# Patient Record
Sex: Male | Born: 1950 | Race: Black or African American | Hispanic: No | Marital: Married | Smoking: Current some day smoker
Health system: Southern US, Community
[De-identification: ages and names within clinical notes are randomized; demographics above are authoritative.]

## PROBLEM LIST (undated history)

## (undated) DIAGNOSIS — C61 Malignant neoplasm of prostate: Secondary | ICD-10-CM

## (undated) DIAGNOSIS — E119 Type 2 diabetes mellitus without complications: Secondary | ICD-10-CM

## (undated) DIAGNOSIS — E46 Unspecified protein-calorie malnutrition: Secondary | ICD-10-CM

---

## 2011-08-23 ENCOUNTER — Emergency Department: Payer: Self-pay | Admitting: Emergency Medicine

## 2011-12-09 ENCOUNTER — Ambulatory Visit: Payer: Self-pay | Admitting: Oncology

## 2011-12-10 LAB — PSA: PSA: 14.1 ng/mL — ABNORMAL HIGH (ref 0.0–4.0)

## 2011-12-13 ENCOUNTER — Ambulatory Visit: Payer: Self-pay | Admitting: Oncology

## 2012-01-13 ENCOUNTER — Ambulatory Visit: Payer: Self-pay | Admitting: Oncology

## 2012-02-02 ENCOUNTER — Emergency Department: Payer: Self-pay | Admitting: Emergency Medicine

## 2012-02-02 LAB — COMPREHENSIVE METABOLIC PANEL
Albumin: 4.1 g/dL (ref 3.4–5.0)
Anion Gap: 15 (ref 7–16)
Bilirubin,Total: 0.9 mg/dL (ref 0.2–1.0)
Chloride: 98 mmol/L (ref 98–107)
Co2: 24 mmol/L (ref 21–32)
Creatinine: 1.14 mg/dL (ref 0.60–1.30)
EGFR (African American): >60
Osmolality: 288 (ref 275–301)
SGOT(AST): 15 U/L (ref 15–37)
Sodium: 137 mmol/L (ref 136–145)
Total Protein: 8.7 g/dL — ABNORMAL HIGH (ref 6.4–8.2)

## 2012-02-02 LAB — CBC
HCT: 49.4 % (ref 40.0–52.0)
MCV: 81 fL (ref 80–100)
Platelet: 253 10*3/uL (ref 150–440)
RBC: 6.09 10*6/uL — ABNORMAL HIGH (ref 4.40–5.90)
RDW: 18.5 % — ABNORMAL HIGH (ref 11.5–14.5)
WBC: 6.4 10*3/uL (ref 3.8–10.6)

## 2012-02-02 LAB — TROPONIN I: Troponin-I: <0.02 ng/mL

## 2012-10-23 IMAGING — US US PELVIS LIMITED
1 series · 17 of 25 positions shown · non-contrast
Comparison: none

REASON FOR EXAM: testicular pain and swelling
COMMENTS:

[Series 1: us pelvis limited · 17 of 55 slices shown]
[im 1/55]
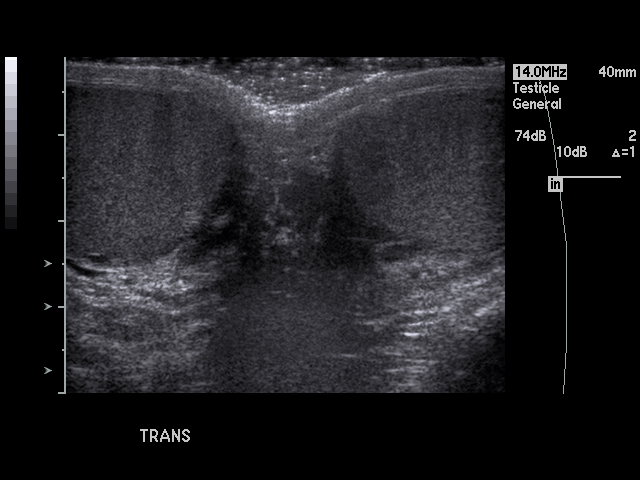
[im 5/55]
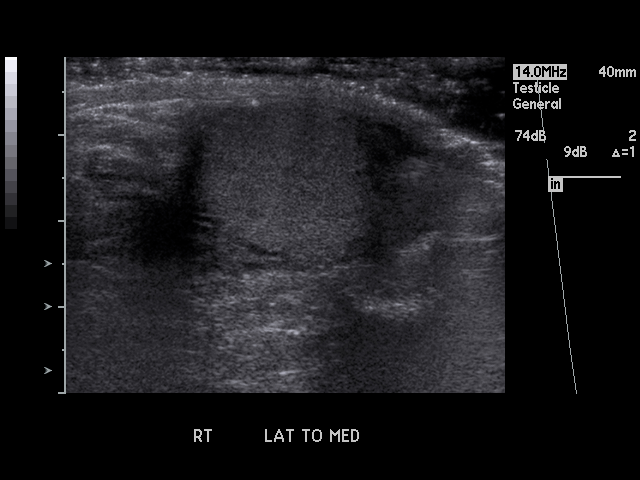
[im 7/55]
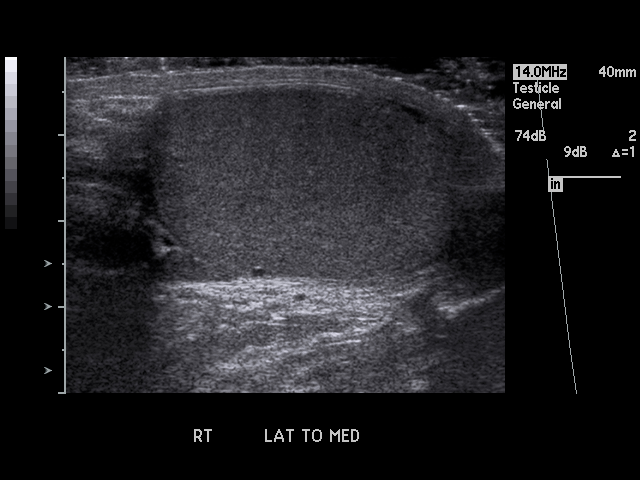
[im 12/55]
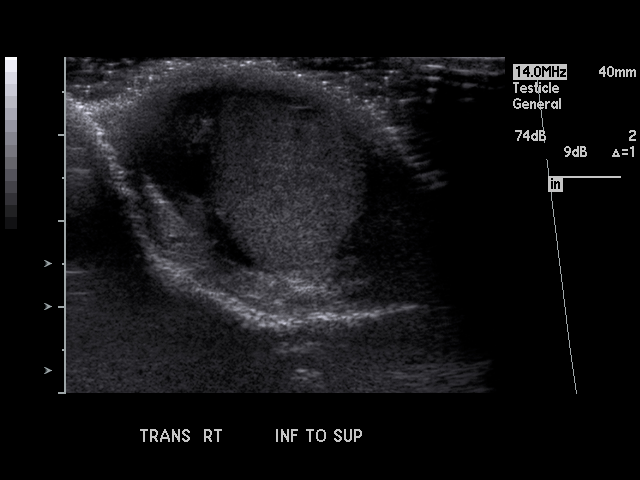
[im 14/55]
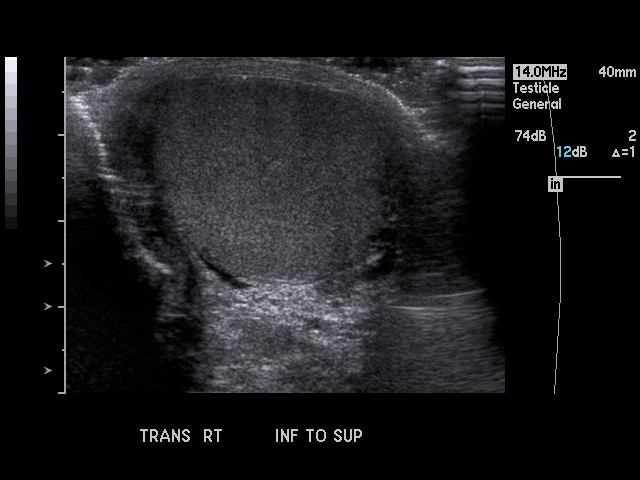
[im 19/55]
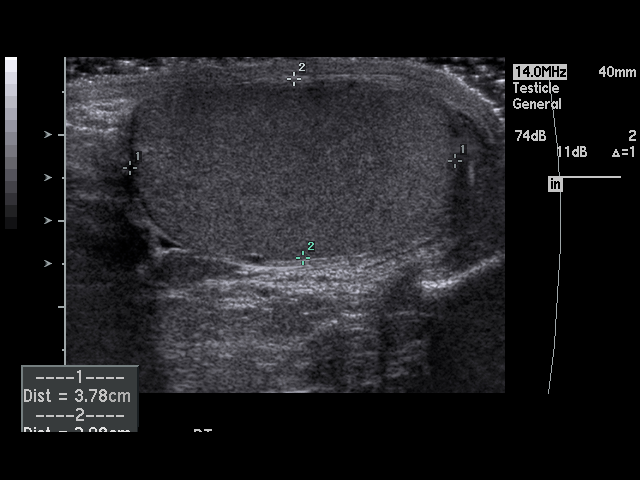
[im 21/55]
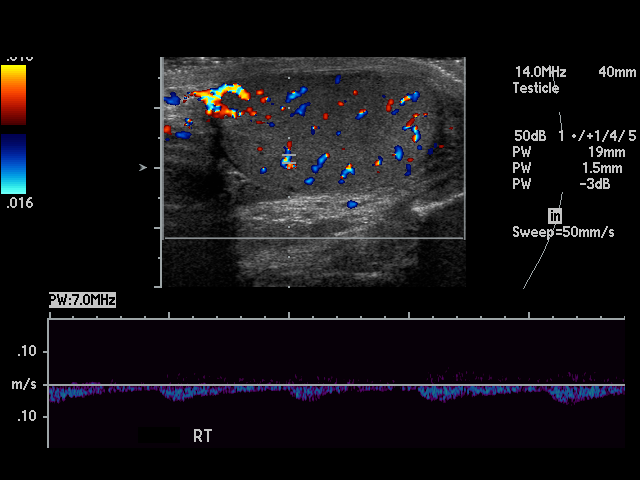
[im 25/55]
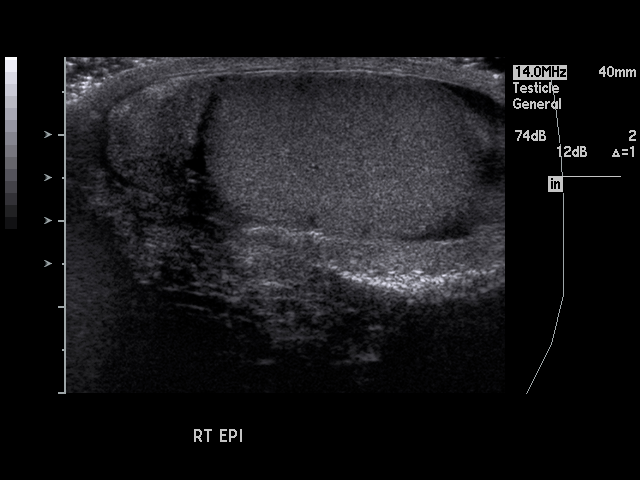
[im 28/55]
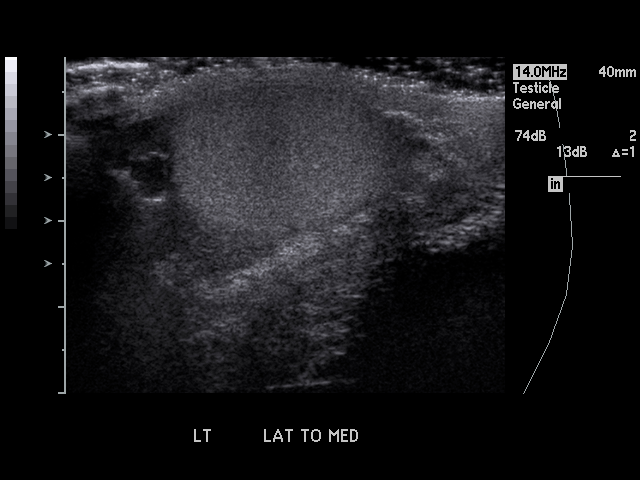
[im 30/55]
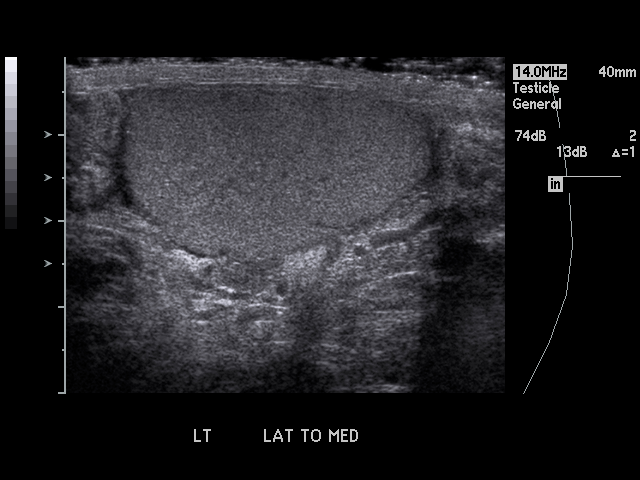
[im 34/55]
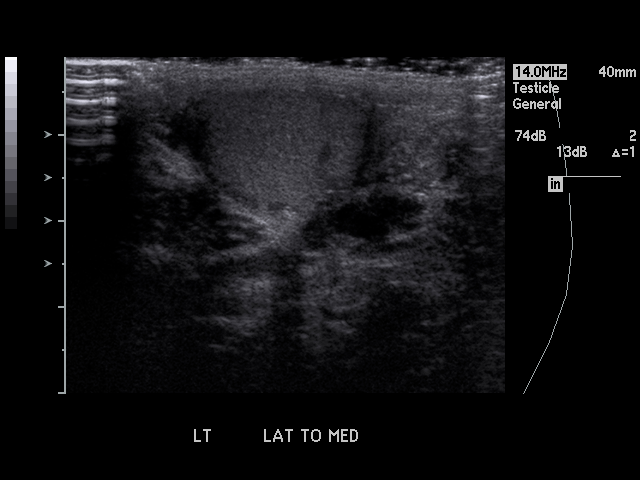
[im 37/55]
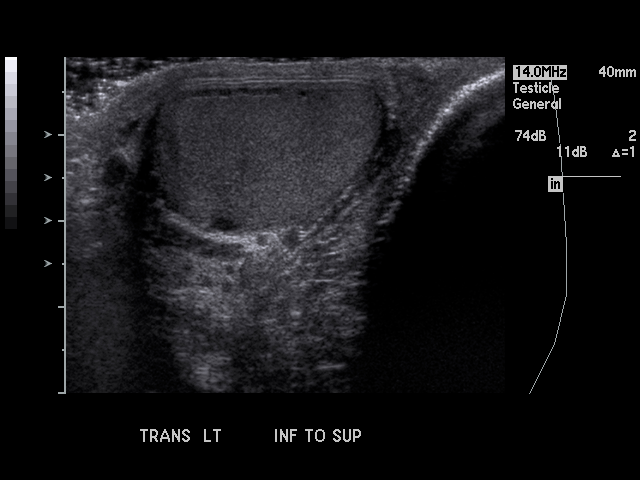
[im 41/55]
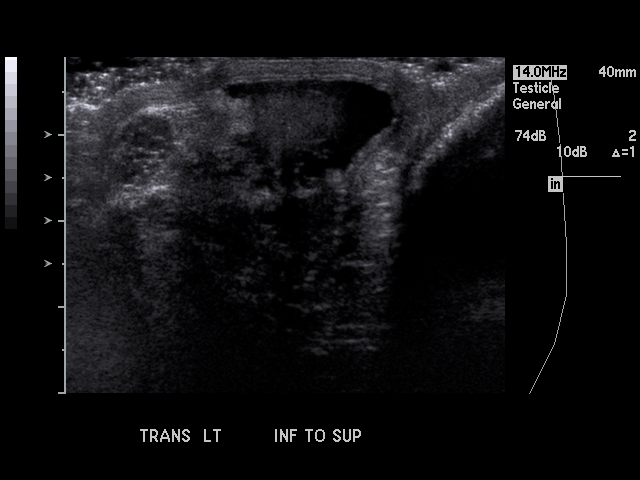
[im 43/55]
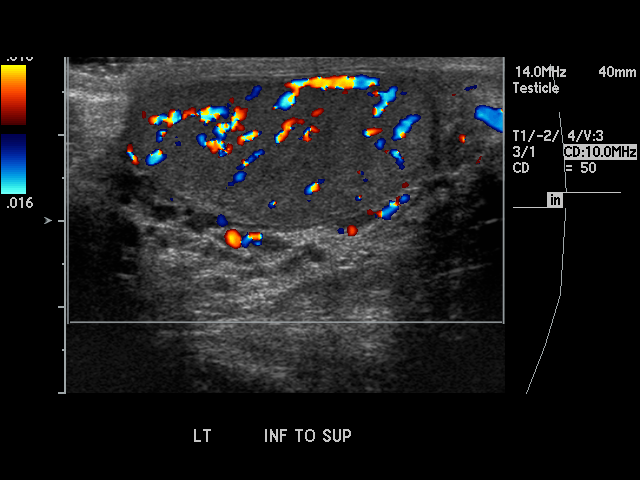
[im 48/55]
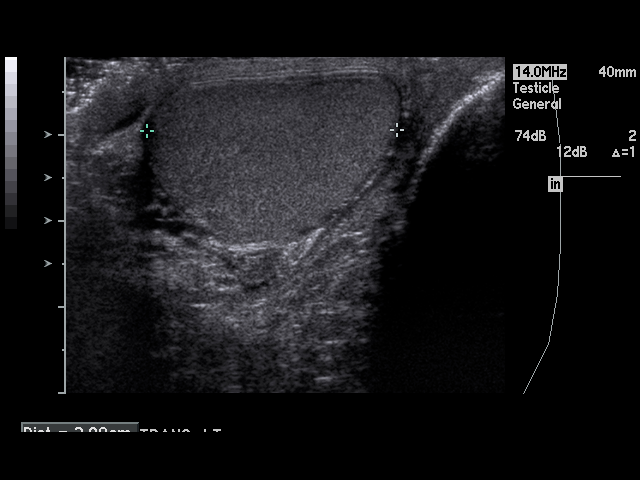
[im 50/55]
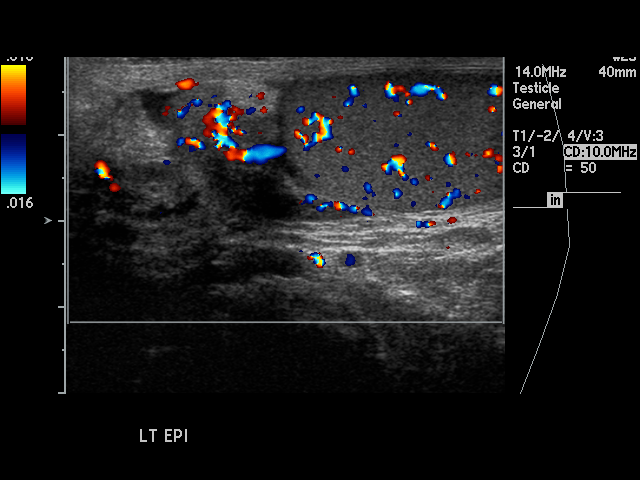
[im 55/55]
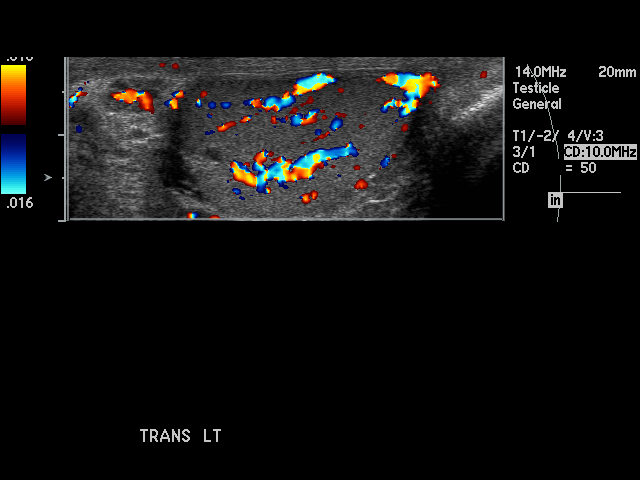

[17 of 25 positions shown; findings below may reference images not displayed]

PROCEDURE:     US  - US TESTICULAR  - August 23, 2011  [DATE]

RESULT:     The testes exhibit normal echotexture. Vascularity appears
normal. A tiny cyst is noted in the left testicle measuring 2 mm in
diameter. The right testicle measures 3.8 x 2.1 x 2.7 cm. The left testicle
measures 3.8 x 1.8 x 2.9 cm. The epididymal structures exhibit normal
echotexture. Vascularity of the epididymal structures is fairly symmetric
from right to left. I see no significant hydrocele nor evidence of a
varicocele.
IMPRESSION: 1. I do not see evidence of testicular torsion.
2. A tiny cyst is noted in the left testicle. Otherwise the testicular
parenchyma is normal in appearance.
3. The left epididymis is slightly larger than the right but the vascularity
appears symmetric from right to left.

## 2013-02-08 IMAGING — CR DG CHEST 2V
1 series · 2 of 2 positions shown · non-contrast
Comparison: none

REASON FOR EXAM: cough
COMMENTS:

[Series 1: pa · 0.17mm/px · 2 of 2 slices shown]
[im 1/2]
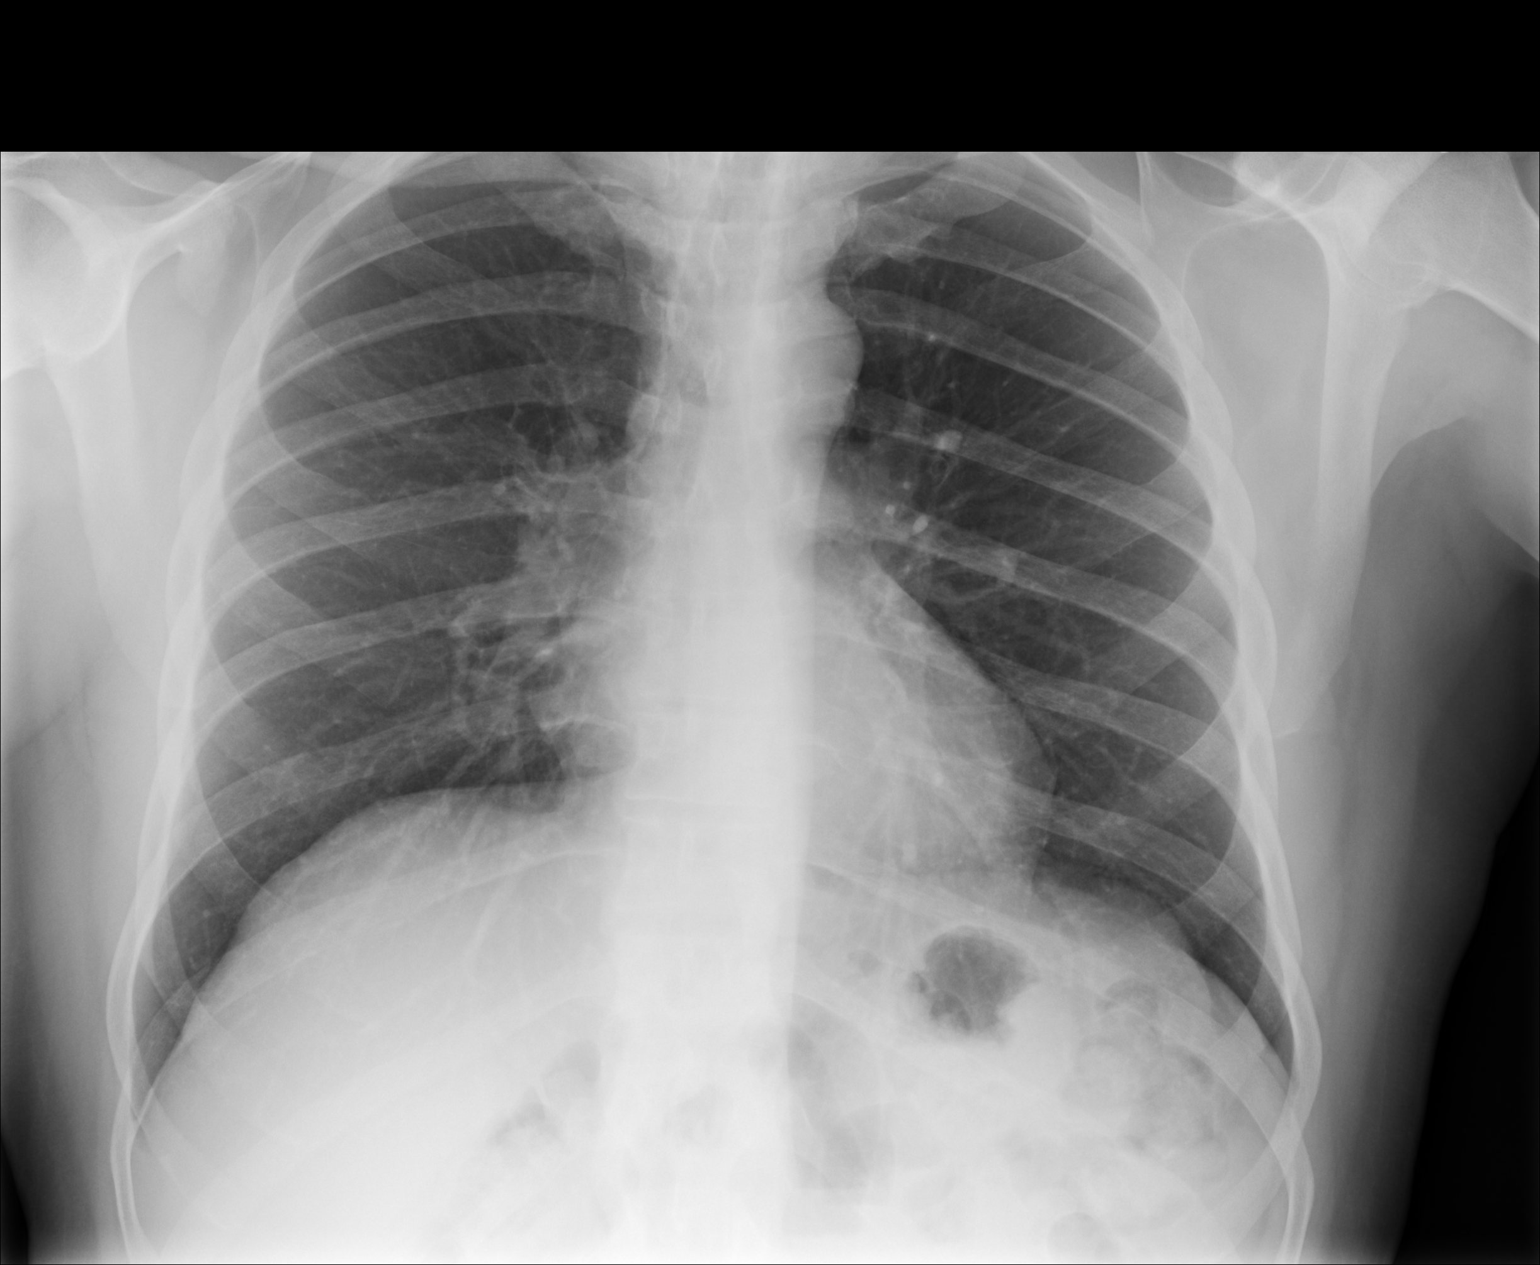
[im 2/2]
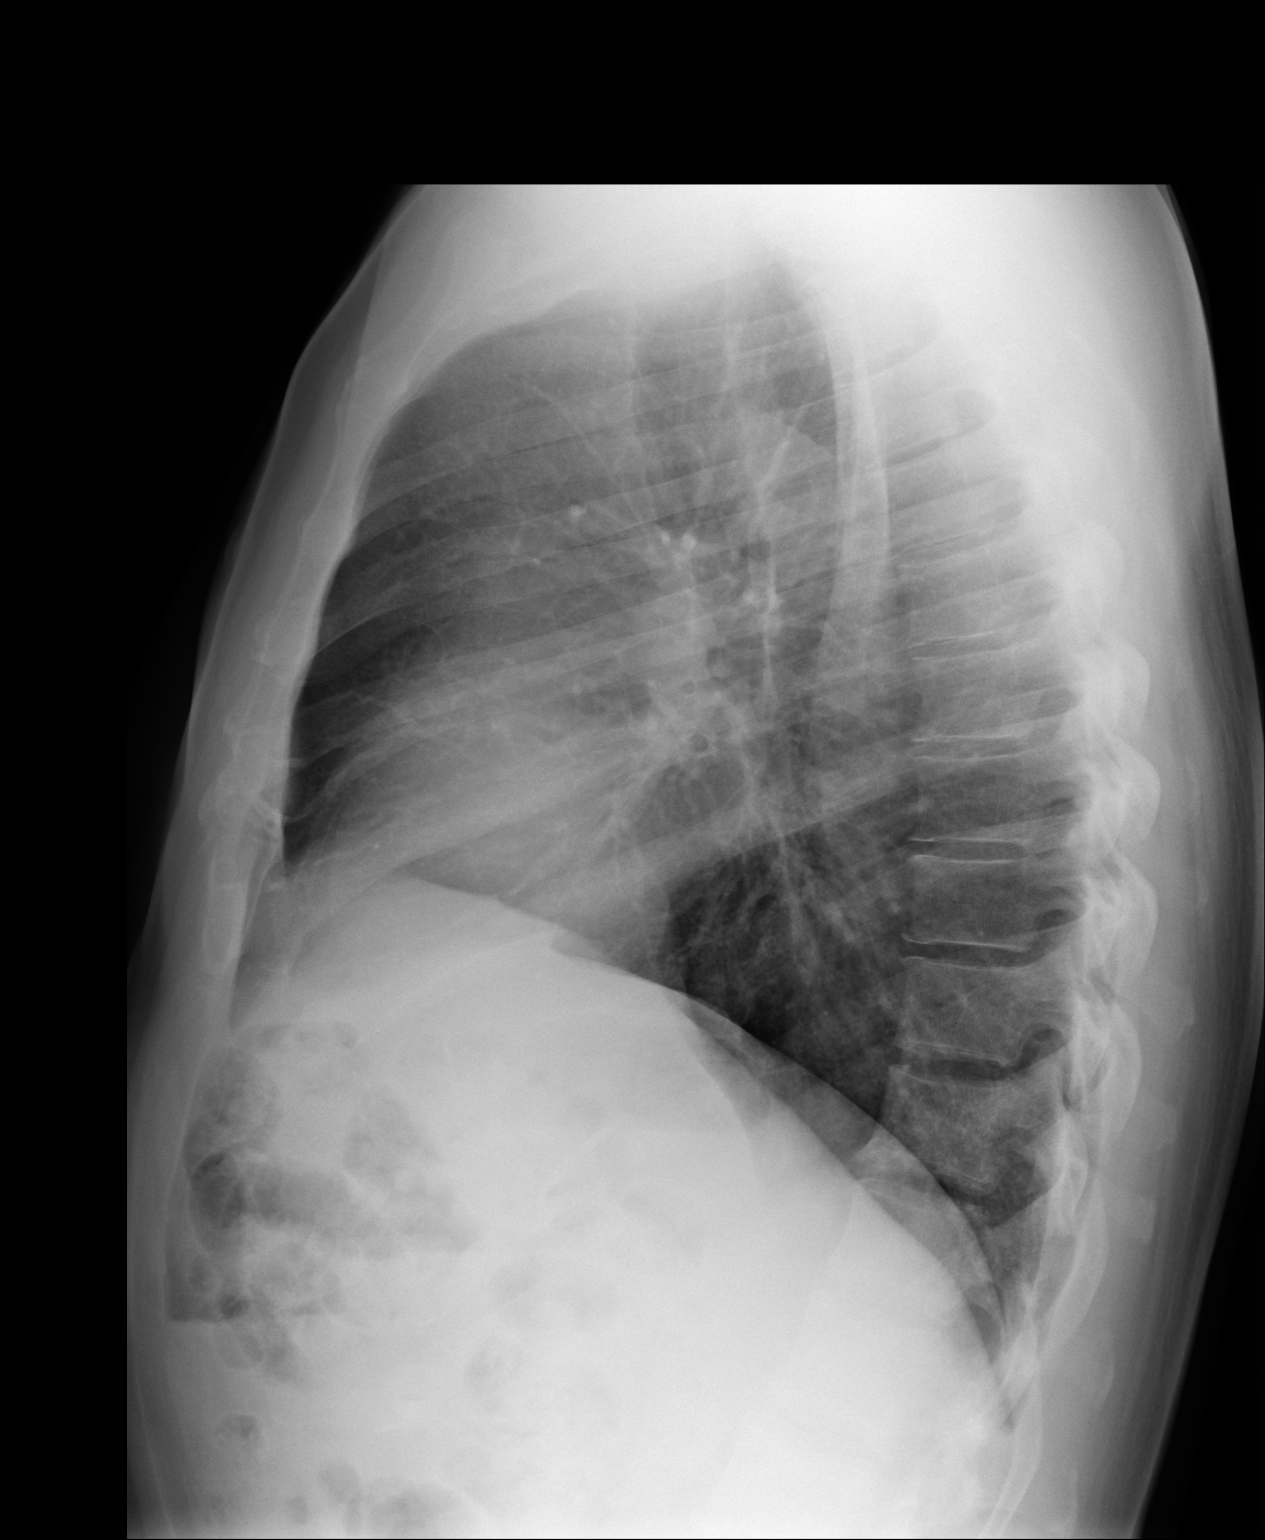

[2 of 2 positions shown; findings below may reference images not displayed]

PROCEDURE:     MDR - MDR CHEST PA(OR AP) AND LATERAL  - December 09, 2011  [DATE]

RESULT:     The lungs are adequately inflated. There is no focal infiltrate.
The cardiac silhouette is normal in size. The pulmonary vascularity is not
engorged. I see no pleural effusion. The mediastinum is normal in width.
IMPRESSION: I do not see evidence of pneumonia nor CHF nor definite
evidence of other acute cardiopulmonary abnormality.

## 2017-05-03 ENCOUNTER — Other Ambulatory Visit: Payer: Self-pay | Admitting: Otolaryngology

## 2017-05-03 DIAGNOSIS — R42 Dizziness and giddiness: Secondary | ICD-10-CM

## 2017-05-12 ENCOUNTER — Ambulatory Visit: Payer: Self-pay

## 2018-06-13 ENCOUNTER — Emergency Department
Admission: EM | Admit: 2018-06-13 | Discharge: 2018-06-13 | Disposition: A | Discharge status: Home / Self Care | Payer: Medicare HMO | Attending: Emergency Medicine | Admitting: Emergency Medicine

## 2018-06-13 ENCOUNTER — Other Ambulatory Visit: Payer: Self-pay

## 2018-06-13 DIAGNOSIS — T675XXA Heat exhaustion, unspecified, initial encounter: Secondary | ICD-10-CM | POA: Diagnosis not present

## 2018-06-13 DIAGNOSIS — E860 Dehydration: Secondary | ICD-10-CM | POA: Diagnosis present

## 2018-06-13 DIAGNOSIS — M6281 Muscle weakness (generalized): Secondary | ICD-10-CM | POA: Diagnosis not present

## 2018-06-13 DIAGNOSIS — R531 Weakness: Secondary | ICD-10-CM

## 2018-06-13 LAB — COMPREHENSIVE METABOLIC PANEL
ALBUMIN: 3.4 g/dL — AB (ref 3.5–5.0)
ALK PHOS: 71 U/L (ref 38–126)
ALT: 10 U/L (ref 0–44)
ANION GAP: 4 — AB (ref 5–15)
AST: 31 U/L (ref 15–41)
BILIRUBIN TOTAL: 1.5 mg/dL — AB (ref 0.3–1.2)
BUN: 14 mg/dL (ref 8–23)
CALCIUM: 8.2 mg/dL — AB (ref 8.9–10.3)
CO2: 26 mmol/L (ref 22–32)
Chloride: 109 mmol/L (ref 98–111)
Creatinine, Ser: 1.12 mg/dL (ref 0.61–1.24)
GFR calc Af Amer: >60 mL/min (ref >60)
Glucose, Bld: 223 mg/dL — ABNORMAL HIGH (ref 70–99)
Potassium: 4.8 mmol/L (ref 3.5–5.1)
Sodium: 139 mmol/L (ref 135–145)
TOTAL PROTEIN: 6.4 g/dL — AB (ref 6.5–8.1)

## 2018-06-13 LAB — CBC
HEMATOCRIT: 42.8 % (ref 40.0–52.0)
HEMOGLOBIN: 14 g/dL (ref 13.0–18.0)
MCH: 26.7 pg (ref 26.0–34.0)
MCHC: 32.7 g/dL (ref 32.0–36.0)
MCV: 81.7 fL (ref 80.0–100.0)
Platelets: 247 10*3/uL (ref 150–440)
RBC: 5.24 MIL/uL (ref 4.40–5.90)
RDW: 19.7 % — AB (ref 11.5–14.5)
WBC: 6.3 10*3/uL (ref 3.8–10.6)

## 2018-06-13 LAB — TROPONIN I

## 2018-06-13 LAB — CK: Total CK: 40 U/L — ABNORMAL LOW (ref 49–397)

## 2018-06-13 MED ORDER — SODIUM CHLORIDE 0.9 % IV SOLN
1000.0000 mL | Freq: Once | INTRAVENOUS | Status: AC
  Administered 2018-06-13: 1000 mL via INTRAVENOUS

## 2018-06-13 NOTE — ED Triage Notes (Signed)
Pt presents today via [facility] from home. Pt was outside to long and became nausea, vomiting, pt presents with an 18g IV Left forearm.... Last pressure is 130/80 NSR on monitor.

## 2018-06-13 NOTE — ED Provider Notes (Signed)
[facility] Emergency Department Provider Note   ____________________________________________    I have reviewed the triage vital signs and the nursing notes.   HISTORY  Chief Complaint Dehydration     HPI George Chavez is a 67 y.o. male who presents with complaints of generalized weakness.  Patient reports he was working in the yard about 4 days ago and the severe heat became dizzy and was drenched with sweat.  He felt like he had to lie down or he might faint.  He had a difficult time getting cool and also had episodes of vomiting.  Since that time he is felt rundown.  He has still has occasional nausea but took some Zofran at home and overall feels weak.  Denies chest pain or palpitations.  No shortness of breath or cough.  No calf pain or swelling   No past medical history on file.  There are no active problems to display for this patient.     Prior to Admission medications   Not on File     Allergies Patient has no allergy information on record.  No family history on file.  Social History Smokes cigarettes, no alcohol  Review of Systems  Constitutional: No fever/chills Eyes: No visual changes.  ENT: No sore throat. Cardiovascular: Denies chest pain. Respiratory: Denies shortness of breath. Gastrointestinal: No abdominal pain.  No nausea, no vomiting.   Genitourinary: Negative for dysuria. Musculoskeletal: Negative for back pain. Skin: Negative for rash. Neurological: Negative for headaches   ____________________________________________   PHYSICAL EXAM:  VITAL SIGNS: ED Triage Vitals  Enc Vitals Group     BP 06/13/18 1354 (!) 143/81     Pulse Rate 06/13/18 1354 60     Resp 06/13/18 1354 13     Temp 06/13/18 1354 98.6 F (37 C)     Temp Source 06/13/18 1354 Oral     SpO2 06/13/18 1354 99 %     Weight 06/13/18 1359 72.6 kg (160 lb)     Height 06/13/18 1359 1.727 m (5\' 8" )     Head Circumference --      Peak Flow  --      Pain Score --      Pain Loc --      Pain Edu? --      Excl. in [place]? --     Constitutional: Alert and oriented. No acute distress. Pleasant and interactive Eyes: Conjunctivae are normal.   Nose: No congestion/rhinnorhea. Mouth/Throat: Mucous membranes are moist.    Cardiovascular: Normal rate, regular rhythm. Grossly normal heart sounds.  Good peripheral circulation. Respiratory: Normal respiratory effort.  No retractions. Lungs CTAB. Gastrointestinal: Soft and nontender. No distention.  No CVA tenderness. Genitourinary: deferred Musculoskeletal: No lower extremity tenderness nor edema.  Warm and well perfused Neurologic:  Normal speech and language. No gross focal neurologic deficits are appreciated.  Skin:  Skin is warm, dry and intact. No rash noted. Psychiatric: Mood and affect are normal. Speech and behavior are normal.  ____________________________________________   LABS (all labs ordered are listed, but only abnormal results are displayed)  Labs Reviewed  CBC - Abnormal; Notable for the following components:      Result Value   RDW 19.7 (*)    All other components within normal limits  COMPREHENSIVE METABOLIC PANEL - Abnormal; Notable for the following components:   Glucose, Bld 223 (*)    Calcium 8.2 (*)    Total Protein 6.4 (*)    Albumin 3.4 (*)  Total Bilirubin 1.5 (*)    Anion gap 4 (*)    All other components within normal limits  CK - Abnormal; Notable for the following components:   Total CK 40 (*)    All other components within normal limits  TROPONIN I   ____________________________________________  EKG   ____________________________________________  RADIOLOGY  None ____________________________________________   PROCEDURES  Procedure(s) performed: No  Procedures   Critical Care performed: No ____________________________________________   INITIAL IMPRESSION / ASSESSMENT AND PLAN / ED COURSE  Pertinent labs & imaging  results that were available during my care of the patient were reviewed by me and considered in my medical decision making (see chart for details).  Patient presents with complaints of diffuse weakness, occasional nausea.  Although he seems to be improving over the last several days.  Does sound consistent with heat exhaustion on day 1 we will check labs give IV fluids evaluate for acute kidney injury, check CK and troponin and CBC.  Lab work is all quite reassuring.  Patient feels significantly better after IV fluids.  No indication for admission at this time, recommend outpatient follow-up, return precautions discussed.      ____________________________________________   FINAL CLINICAL IMPRESSION(S) / ED DIAGNOSES  Final diagnoses:  Generalized weakness  Heat exhaustion, initial encounter        Note:  This document was prepared using Dragon voice recognition software and may include unintentional dictation errors.    Lavonia Drafts, MD 06/13/18 [phone]

## 2021-08-24 ENCOUNTER — Emergency Department
Admission: EM | Admit: 2021-08-24 | Discharge: 2021-08-24 | Disposition: A | Discharge status: Home / Self Care | Payer: Medicare HMO | Attending: Student in an Organized Health Care Education/Training Program | Admitting: Student in an Organized Health Care Education/Training Program

## 2021-08-24 ENCOUNTER — Other Ambulatory Visit: Payer: Self-pay

## 2021-08-24 DIAGNOSIS — Z5321 Procedure and treatment not carried out due to patient leaving prior to being seen by health care provider: Secondary | ICD-10-CM | POA: Insufficient documentation

## 2021-08-24 DIAGNOSIS — R531 Weakness: Secondary | ICD-10-CM | POA: Diagnosis present

## 2021-08-24 DIAGNOSIS — R630 Anorexia: Secondary | ICD-10-CM | POA: Insufficient documentation

## 2021-08-24 LAB — BASIC METABOLIC PANEL
Anion gap: 13 (ref 5–15)
BUN: 24 mg/dL — ABNORMAL HIGH (ref 8–23)
CO2: 29 mmol/L (ref 22–32)
Calcium: 10.7 mg/dL — ABNORMAL HIGH (ref 8.9–10.3)
Chloride: 95 mmol/L — ABNORMAL LOW (ref 98–111)
Creatinine, Ser: 1.2 mg/dL (ref 0.61–1.24)
GFR, Estimated: >60 mL/min (ref >60)
Glucose, Bld: 156 mg/dL — ABNORMAL HIGH (ref 70–99)
Potassium: 4.3 mmol/L (ref 3.5–5.1)
Sodium: 137 mmol/L (ref 135–145)

## 2021-08-24 LAB — CBC
HCT: 43.2 % (ref 39.0–52.0)
Hemoglobin: 14.2 g/dL (ref 13.0–17.0)
MCH: 26.1 pg (ref 26.0–34.0)
MCHC: 32.9 g/dL (ref 30.0–36.0)
MCV: 79.3 fL — ABNORMAL LOW (ref 80.0–100.0)
Platelets: 259 10*3/uL (ref 150–400)
RBC: 5.45 MIL/uL (ref 4.22–5.81)
RDW: 18.1 % — ABNORMAL HIGH (ref 11.5–15.5)
WBC: 5.9 10*3/uL (ref 4.0–10.5)
nRBC: 0 % (ref 0.0–0.2)

## 2021-08-24 NOTE — ED Triage Notes (Signed)
Pt to ED via [facility] with complaints of weakness, loss of appetite. He has lost some weight in the last few weeks.

## 2021-11-11 ENCOUNTER — Telehealth: Payer: Self-pay | Admitting: Nurse Practitioner

## 2021-11-11 NOTE — Telephone Encounter (Signed)
Attempted to reach patient/wife on the home and cell number to offer to schedule a Palliative Consult, no answer.  I wasn't able to leave a message on wife's cell but did leave a message at the home number with reason for call along with my name and call back number, requesting a return call.

## 2021-12-03 ENCOUNTER — Telehealth: Payer: Self-pay | Admitting: Nurse Practitioner

## 2021-12-03 NOTE — Telephone Encounter (Signed)
Rec'd return call from patient's son Efton, and after discussing the Palliative referral/services with him he and patient were in agreement with beginning Palliative services.  I have scheduled an In-home Consult for 12/16/21 @ 3:30 PM

## 2021-12-03 NOTE — Telephone Encounter (Signed)
Attempted to contact patient's wife Levander Campion to schedule Palliative Consult, no answer - left message requesting a return call.  I also called and left a message at the home number as well.

## 2021-12-16 ENCOUNTER — Other Ambulatory Visit: Payer: Self-pay | Admitting: Nurse Practitioner

## 2021-12-17 ENCOUNTER — Other Ambulatory Visit: Payer: Self-pay | Admitting: Nurse Practitioner

## 2021-12-17 ENCOUNTER — Other Ambulatory Visit: Payer: Self-pay

## 2021-12-17 ENCOUNTER — Encounter: Payer: Self-pay | Admitting: Nurse Practitioner

## 2021-12-17 VITALS — HR 114 | Resp 20

## 2021-12-17 DIAGNOSIS — E860 Dehydration: Secondary | ICD-10-CM

## 2021-12-17 DIAGNOSIS — R630 Anorexia: Secondary | ICD-10-CM

## 2021-12-17 DIAGNOSIS — C61 Malignant neoplasm of prostate: Secondary | ICD-10-CM

## 2021-12-17 DIAGNOSIS — C7951 Secondary malignant neoplasm of bone: Secondary | ICD-10-CM

## 2021-12-17 DIAGNOSIS — Z515 Encounter for palliative care: Secondary | ICD-10-CM

## 2021-12-17 NOTE — Progress Notes (Signed)
[facility] Palliative Care Consult Note Telephone: [phone]  Fax: [phone]   Date of encounter: 12/17/21 4:57 PM PATIENT NAME: George Chavez [address] [place] [address]   [phone] (home)  DOB: [birth year] MRN: [id number] PRIMARY CARE PROVIDER:    [facility],  [place] [address] [phone]  REFERRING PROVIDER:   [facility] [place],  [place] [address] [phone]  RESPONSIBLE PARTY:    Contact Information     Name Relation Home Work Mobile   George Chavez Spouse [phone]  [phone]   [place] Parent [phone]        I met face to face with patient and family in home. Palliative Care was asked to follow this patient by consultation request of  George Chavez, [facility] to address advance care planning and complex medical decision making. This is the initial visit.  ASSESSMENT AND PLAN / RECOMMENDATIONS:  Advance Care Planning/Goals of Care: Goals include to maximize quality of life and symptom management. Patient/health care surrogate gave his/her permission to discuss.Our advance care planning conversation included a discussion about:    The value and importance of advance care planning  Experiences with loved ones who have been seriously ill or have died  Exploration of personal, cultural or spiritual beliefs that might influence medical decisions  Exploration of goals of care in the event of a sudden injury or illness  Identification  of a healthcare agent  Review and updating or creation of an  advance directive document . Decision not to resuscitate or to de-escalate disease focused treatments due to poor prognosis. CODE STATUS: DNR  Symptom Management/Plan: 1. Advance Care Planning; Discussed code status with George Chavez wishes for DNR; goldenrod form completed, placed in vynca. Discussed at length with George Chavez, George Chavez daughter in law, son George Chavez (by  video) and George Chavez concerning aggressive vs conservative vs comfort care. We talked about current treatment with radiation/chemotherapy with palliative focus. We talked about quality of life.  2. Anorexia/dehydration secondary to metastatic prostate cancer discussed nutrition; discussed with George Chavez, George Chavez, son and George Chavez Daughter inlaw concern for severe dehydration reflective tachycardia, dry mucous membranes, severe weakness. Temporal wasting, cachetic with clinical presentation. Discussed options for rehydration and recommended to go to ED for IVF's. George Chavez and George Chavez wishes to take George. Buenger back to [place] where his Oncologist is located. George Chavez, George Chavez and I talked about George Chavez no longer able to be by himself or live independently. George Chavez and George Chavez reside in [place] with hope of transporting George Chavez home with them though they have not established care with Oncology in that area and next chemotherapy is in 1 week. We talked at length chemotherapy vs comfort care. We talked about overall rapid decline, severe debility, George. Costlow appears very ill. George Chavez wishes to take George Chavez back to [facility] ED by POV. Discussed risks of transport POV rather than EMS. We talked about code status. George Chavez wishes to be a DNR, golden rod form completed and will put in vynca. We talked about role pc. Assisted George Chavez to get George Chavez into her vehicle. George Chavez was alert, oriented, waved bye. Therapeutic listening, emotional support provided. Questions answered. Will f/u once disposition made. Discussed with George Chavez separately from George Chavez if aggressive treatment is ceased then can proceed with Hospice services as George. Seavey does appear to be at end of life. George Chavez in agreement, will request visit with  Oncology to further discuss.   4. Goals of Care: Goals include to maximize quality of life and symptom management. Our advance care planning conversation included a discussion about:    The value and  importance of advance care planning  Exploration of personal, cultural or spiritual beliefs that might influence medical decisions  Exploration of goals of care in the event of a sudden injury or illness  Identification and preparation of a healthcare agent  Review and updating or creation of an advance directive document.  5. Palliative care encounter; Palliative care encounter; Palliative medicine team will continue to support patient, patient's family, and medical team. Visit consisted of counseling and education dealing with the complex and emotionally intense issues of symptom management and palliative care in the setting of serious and potentially life-threatening illness  Follow up Palliative Care Visit: Palliative care will continue to follow for complex medical decision making, advance care planning, and clarification of goals. Return 2 weeks or prn.  I spent 78 minutes providing this consultation. More than 50% of the time in this consultation was spent in counseling and care coordination. PPS: 30% Chief Complaint: Initial Palliative consult for complex medical decision making  HISTORY OF PRESENT ILLNESS:  George Chavez is a 71 y.o. 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 I called to confirm initial PC visit with George Chavez, George. Chavez daughter-inloaw in agreement with covid negative. I visited George. Chavez in his home, he was sitting on the couch, appears severely debilitated, weak, cachetic with temporal wasting, dry. George. Chavez did make eye contact but quickly fell back asleep. George Chavez George. Chavez daughter-in-law in person, George Chavez (George Ellis son per video) discussed pmh, dx cancer with ongoing treatment. We talked about symptoms, ros, we talked about pain. George. Schipani endorses the current pain medication does help with relief. We talked about  recent falls. We talked about difficulty ambulating as George. Min does use a cane. George. Winch has been unable to independently go from a sitting to standing position. George. Hoard talked about not eating much, no appetite. George. Winterhalter has been residing independently with George Chavez and Hoy making intermit visits. George Chavez endorses she fixes his medications box, ensure he has food. We talked about concern of George. Langland staying by himself, safety concern. George Chavez endorses they have been thinking about bringing George. Glander back to [place] although has not established with Oncology. We talked about chemotherapy, radiation, quality of life. We talked about current clinical presentation. We talked about concern for need for IVF as George. Rambert does appear severely dehydrated. See plan. George. Jaycob, Mcclenton and Samnang in agreement to plan. Therapeutic listening, emotional support provided. Assisted George Chavez to dress George. Tugwell and used a chair to transfer him from the couch to the car safety. Questions answered.   ONCOLOGY HISTORY   Cancer Staging  No matching staging information was found for the patient.  06/28/16 robotic assisted laparoscopic prostatectomy with bilateral lymph node dissection. Final surgical pathology - pT2a, N0, Mx, R0, Gleason 3+4=7 adenocarcinoma of the prostate. There was absence of seminal vesicle invasion. There was absence of extracapsular extension. There was absence of bladder neck invasion. 08/08/16 PSA 0.05 11/07/16 PSA 0.04 02/13/17 PSA 0.08 03/06/17 - Rad onc consult; recommended for salvage radiation 05/11/17 - had a stroke, lost to follow up to rad onc and urology 09/29/21 - presents to ED; CT LUMBAR/THORACIC SPINE: Diffuse  lytic lesions throughout the axial skeleton including the thoracic and lumbar spine consistent with osseous metastases. Age-indeterminate pathologic fractures of the T9 and T12 vertebral bodies with mild vertebral body height loss. No retropulsion. Additional possible  age-indeterminate pathologic fracture versus pathologic Schmorl's node of the L2 inferior endplate. Extensive epidural extension of tumor in the sacrum encasing the bilateral sacral nerve roots. Extent of epidural involvement would be better characterized on George. Patchy opacities in the right middle and lower lobes of the lungs, possibly representing infection or inflammation. Left AMA 10/05/21 - urgent add on med onc clinic visit; saddle anesthesia, sent to ED; MRI showed widespread spinal osseous metastatic disease with pathologic compression deformities of the T3 and T12 vertebral bodies. Mild epidural metastatic disease posterior to the T12 vertebral body. Sacral osseous metastatic disease includes soft tissue extension/epidural disease which appears to encase some of the exiting sacral nerves, however given plane of imaging this is suboptimally evaluated. If clinically indicated, dedicated MRI of the sacrum with and without contrast could provide further characterization. Incompletely evaluated subcentimeter left hepatic lobe lesion is indeterminate. If clinically warranted, dedicated liver protocol MRI could provide further characterization. PSA >7000 10/08/21: urgent add on back to clinic; start firmagon, consent for docetaxel, start decadron 12m PO daily, rad onc referral, start oxycodone 11/7-11/11/22: radiation to L2-L3  10/29/21: PSA 2,250; C1D1 docetaxel  History obtained from review of EMR, discussion with primary team, and interview with family, facility staff/caregiver and/or George. PWrede  I reviewed available labs, medications, imaging, studies and related documents from the EMR.  Records reviewed and summarized above.   ROS Reviewed 10 point system all negative except HPI  Physical Exam: Constitutional: NAD General: frail appearing, cachetic, severe muscle wasting; severe weakness, chronically ill, male EYES: lids intact, temporal wasting ENMT: oral mucous membranes dry CV: S1S2,  RRR Pulmonary: decrease breath sounds throughout; no increased work of breathing, +cough, room air Abdomen:  soft and non tender MSK: able to stand when lifted to pivot; severe muscle wasting Skin: warm and dry Neuro:  +severe generalized weakness,  no cognitive impairment Psych: flat affect, A and O x 3  Social/FAMILY HX: reviewed  ALLERGIES: No Known Allergies   Thank you for the opportunity to participate in the care of George. PBallentine  The palliative care team will continue to follow. Please call our office at [phone] we can be of additional assistance.   Questions and concerns were addressed. The patient/family was encouraged to call with questions and/or concerns. My business card was provided. Provided general support and encouragement, no other unmet needs identified   Tyreka Henneke Z Kaetlyn Noa, NP ,   COVID-19 PATIENT SCREENING TOOL Asked and negative response unless otherwise noted:  Have you had symptoms of covid, tested positive or been in contact with someone with symptoms/positive test in the past 5-10 days? NO

## 2022-01-07 ENCOUNTER — Telehealth: Payer: Self-pay

## 2022-01-07 NOTE — Telephone Encounter (Signed)
1223 pm.  Message received from Natalia Leatherwood, NP to schedule a home visit with patient after hospital discharge.  Phone call made to the listed number but no answer.  Message left requesting a call back.

## 2022-01-19 ENCOUNTER — Telehealth: Payer: Self-pay

## 2022-01-19 NOTE — Telephone Encounter (Signed)
141 pm.  Phone call made to Dow Adolph to complete a check-in and schedule a home visit.  No answer.  Message has been left requesting a call back.

## 2022-02-27 ENCOUNTER — Inpatient Hospital Stay (HOSPITAL_COMMUNITY)
Admission: EM | Admit: 2022-02-27 | Discharge: 2022-03-04 | DRG: 391 | Disposition: A | Discharge status: Home / Self Care | Payer: Medicare HMO | Attending: Internal Medicine | Admitting: Internal Medicine

## 2022-02-27 ENCOUNTER — Emergency Department (HOSPITAL_COMMUNITY): Payer: Medicare HMO

## 2022-02-27 ENCOUNTER — Encounter (HOSPITAL_COMMUNITY): Payer: Self-pay | Admitting: Emergency Medicine

## 2022-02-27 ENCOUNTER — Other Ambulatory Visit: Payer: Self-pay

## 2022-02-27 DIAGNOSIS — E785 Hyperlipidemia, unspecified: Secondary | ICD-10-CM | POA: Diagnosis present

## 2022-02-27 DIAGNOSIS — I959 Hypotension, unspecified: Secondary | ICD-10-CM | POA: Diagnosis present

## 2022-02-27 DIAGNOSIS — E869 Volume depletion, unspecified: Secondary | ICD-10-CM | POA: Diagnosis present

## 2022-02-27 DIAGNOSIS — R109 Unspecified abdominal pain: Secondary | ICD-10-CM | POA: Diagnosis present

## 2022-02-27 DIAGNOSIS — C7951 Secondary malignant neoplasm of bone: Secondary | ICD-10-CM | POA: Diagnosis present

## 2022-02-27 DIAGNOSIS — R1084 Generalized abdominal pain: Secondary | ICD-10-CM

## 2022-02-27 DIAGNOSIS — C61 Malignant neoplasm of prostate: Secondary | ICD-10-CM | POA: Diagnosis present

## 2022-02-27 DIAGNOSIS — E43 Unspecified severe protein-calorie malnutrition: Secondary | ICD-10-CM | POA: Diagnosis present

## 2022-02-27 DIAGNOSIS — K5900 Constipation, unspecified: Principal | ICD-10-CM

## 2022-02-27 DIAGNOSIS — L89152 Pressure ulcer of sacral region, stage 2: Secondary | ICD-10-CM | POA: Diagnosis present

## 2022-02-27 DIAGNOSIS — Z20822 Contact with and (suspected) exposure to covid-19: Secondary | ICD-10-CM | POA: Diagnosis present

## 2022-02-27 DIAGNOSIS — G893 Neoplasm related pain (acute) (chronic): Secondary | ICD-10-CM | POA: Diagnosis present

## 2022-02-27 DIAGNOSIS — K5903 Drug induced constipation: Principal | ICD-10-CM | POA: Diagnosis present

## 2022-02-27 DIAGNOSIS — T402X5A Adverse effect of other opioids, initial encounter: Secondary | ICD-10-CM | POA: Diagnosis present

## 2022-02-27 DIAGNOSIS — Z79899 Other long term (current) drug therapy: Secondary | ICD-10-CM

## 2022-02-27 DIAGNOSIS — L8990 Pressure ulcer of unspecified site, unspecified stage: Secondary | ICD-10-CM | POA: Insufficient documentation

## 2022-02-27 DIAGNOSIS — E872 Acidosis, unspecified: Secondary | ICD-10-CM | POA: Diagnosis present

## 2022-02-27 DIAGNOSIS — Z79891 Long term (current) use of opiate analgesic: Secondary | ICD-10-CM

## 2022-02-27 DIAGNOSIS — Z681 Body mass index (BMI) 19 or less, adult: Secondary | ICD-10-CM

## 2022-02-27 DIAGNOSIS — T458X5A Adverse effect of other primarily systemic and hematological agents, initial encounter: Secondary | ICD-10-CM | POA: Diagnosis present

## 2022-02-27 DIAGNOSIS — F17210 Nicotine dependence, cigarettes, uncomplicated: Secondary | ICD-10-CM | POA: Diagnosis present

## 2022-02-27 DIAGNOSIS — D72829 Elevated white blood cell count, unspecified: Secondary | ICD-10-CM | POA: Diagnosis present

## 2022-02-27 DIAGNOSIS — Z23 Encounter for immunization: Secondary | ICD-10-CM

## 2022-02-27 HISTORY — DX: Malignant neoplasm of prostate: C61

## 2022-02-27 LAB — COMPREHENSIVE METABOLIC PANEL
ALT: 11 U/L (ref 0–44)
AST: 29 U/L (ref 15–41)
Albumin: 3.1 g/dL — ABNORMAL LOW (ref 3.5–5.0)
Alkaline Phosphatase: 126 U/L (ref 38–126)
Anion gap: 13 (ref 5–15)
BUN: 13 mg/dL (ref 8–23)
CO2: 22 mmol/L (ref 22–32)
Calcium: 7.7 mg/dL — ABNORMAL LOW (ref 8.9–10.3)
Chloride: 98 mmol/L (ref 98–111)
Creatinine, Ser: 0.76 mg/dL (ref 0.61–1.24)
GFR, Estimated: >60 mL/min (ref >60)
Glucose, Bld: 154 mg/dL — ABNORMAL HIGH (ref 70–99)
Potassium: 4.4 mmol/L (ref 3.5–5.1)
Sodium: 133 mmol/L — ABNORMAL LOW (ref 135–145)
Total Bilirubin: 0.9 mg/dL (ref 0.3–1.2)
Total Protein: 5.5 g/dL — ABNORMAL LOW (ref 6.5–8.1)

## 2022-02-27 LAB — CBC
HCT: 37.2 % — ABNORMAL LOW (ref 39.0–52.0)
Hemoglobin: 12.2 g/dL — ABNORMAL LOW (ref 13.0–17.0)
MCH: 26.8 pg (ref 26.0–34.0)
MCHC: 32.8 g/dL (ref 30.0–36.0)
MCV: 81.6 fL (ref 80.0–100.0)
Platelets: 349 10*3/uL (ref 150–400)
RBC: 4.56 MIL/uL (ref 4.22–5.81)
RDW: 22 % — ABNORMAL HIGH (ref 11.5–15.5)
WBC: 50.5 10*3/uL (ref 4.0–10.5)
nRBC: 0 % (ref 0.0–0.2)

## 2022-02-27 LAB — LACTIC ACID, PLASMA
Lactic Acid, Venous: 4.9 mmol/L (ref 0.5–1.9)
Lactic Acid, Venous: 3.1 mmol/L (ref 0.5–1.9)

## 2022-02-27 LAB — PROTIME-INR
INR: 1 (ref 0.8–1.2)
Prothrombin Time: 13.4 seconds (ref 11.4–15.2)

## 2022-02-27 LAB — LIPASE, BLOOD: Lipase: 23 U/L (ref 11–51)

## 2022-02-27 LAB — RESP PANEL BY RT-PCR (FLU A&B, COVID) ARPGX2
Influenza A by PCR: NEGATIVE
Influenza B by PCR: NEGATIVE
SARS Coronavirus 2 by RT PCR: NEGATIVE

## 2022-02-27 LAB — APTT: aPTT: 27 seconds (ref 24–36)

## 2022-02-27 MED ORDER — LACTATED RINGERS IV SOLN: INTRAVENOUS | Status: AC

## 2022-02-27 MED ORDER — KETOROLAC TROMETHAMINE 15 MG/ML IJ SOLN
15.0000 mg | Freq: Four times a day (QID) | INTRAMUSCULAR | Status: DC | PRN
  Administered 2022-03-01 – 2022-03-02 (×3): 15 mg via INTRAVENOUS
  Filled 2022-02-27 (×3): qty 1

## 2022-02-27 MED ORDER — ENSURE ENLIVE PO LIQD
237.0000 mL | Freq: Two times a day (BID) | ORAL | Status: DC
  Administered 2022-02-28 – 2022-03-01 (×2): 237 mL via ORAL

## 2022-02-27 MED ORDER — VANCOMYCIN HCL IN DEXTROSE 1-5 GM/200ML-% IV SOLN
1000.0000 mg | Freq: Once | INTRAVENOUS | Status: AC
  Administered 2022-02-27: 1000 mg via INTRAVENOUS
  Filled 2022-02-27: qty 200

## 2022-02-27 MED ORDER — SODIUM CHLORIDE 0.9 % IV SOLN
2.0000 g | Freq: Two times a day (BID) | INTRAVENOUS | Status: DC
  Administered 2022-02-27: 2 g via INTRAVENOUS
  Filled 2022-02-27: qty 2

## 2022-02-27 MED ORDER — POLYETHYLENE GLYCOL 3350 17 G PO PACK
17.0000 g | PACK | Freq: Two times a day (BID) | ORAL | Status: DC
  Administered 2022-02-27 – 2022-03-04 (×9): 17 g via ORAL
  Filled 2022-02-27 (×10): qty 1

## 2022-02-27 MED ORDER — LACTATED RINGERS IV BOLUS
1000.0000 mL | Freq: Once | INTRAVENOUS | Status: AC
  Administered 2022-02-27: 1000 mL via INTRAVENOUS

## 2022-02-27 MED ORDER — FLEET ENEMA 7-19 GM/118ML RE ENEM
1.0000 | ENEMA | Freq: Once | RECTAL | Status: AC
  Administered 2022-02-28: 1 via RECTAL
  Filled 2022-02-27: qty 1

## 2022-02-27 MED ORDER — ONDANSETRON HCL 4 MG PO TABS: 4.0000 mg | ORAL_TABLET | Freq: Four times a day (QID) | ORAL | Status: DC | PRN

## 2022-02-27 MED ORDER — INFLUENZA VAC A&B SA ADJ QUAD 0.5 ML IM PRSY
0.5000 mL | PREFILLED_SYRINGE | INTRAMUSCULAR | Status: AC
  Administered 2022-03-01: 0.5 mL via INTRAMUSCULAR
  Filled 2022-02-27: qty 0.5

## 2022-02-27 MED ORDER — ENOXAPARIN SODIUM 30 MG/0.3ML IJ SOSY
30.0000 mg | PREFILLED_SYRINGE | INTRAMUSCULAR | Status: DC
  Administered 2022-02-27 – 2022-03-03 (×5): 30 mg via SUBCUTANEOUS
  Filled 2022-02-27 (×5): qty 0.3

## 2022-02-27 MED ORDER — SODIUM CHLORIDE 0.9 % IV SOLN: 2.0000 g | Freq: Three times a day (TID) | INTRAVENOUS | Status: DC

## 2022-02-27 MED ORDER — SENNA 8.6 MG PO TABS
1.0000 | ORAL_TABLET | Freq: Two times a day (BID) | ORAL | Status: DC
  Administered 2022-02-27 – 2022-03-03 (×8): 8.6 mg via ORAL
  Filled 2022-02-27 (×9): qty 1

## 2022-02-27 MED ORDER — ONDANSETRON HCL 4 MG/2ML IJ SOLN: 4.0000 mg | Freq: Four times a day (QID) | INTRAMUSCULAR | Status: DC | PRN

## 2022-02-27 MED ORDER — LIDOCAINE 5 % EX PTCH
1.0000 | MEDICATED_PATCH | CUTANEOUS | Status: DC
  Administered 2022-02-27 – 2022-03-03 (×5): 1 via TRANSDERMAL
  Filled 2022-02-27 (×5): qty 1

## 2022-02-27 MED ORDER — GLYCERIN (LAXATIVE) 2 G RE SUPP
1.0000 | Freq: Once | RECTAL | Status: AC
  Administered 2022-02-27: 1 via RECTAL
  Filled 2022-02-27: qty 1

## 2022-02-27 MED ORDER — BISACODYL 10 MG RE SUPP: 10.0000 mg | Freq: Every day | RECTAL | Status: DC | PRN

## 2022-02-27 MED ORDER — VANCOMYCIN HCL 750 MG/150ML IV SOLN: 750.0000 mg | INTRAVENOUS | Status: DC

## 2022-02-27 MED ORDER — FLEET ENEMA 7-19 GM/118ML RE ENEM: 1.0000 | ENEMA | Freq: Once | RECTAL | Status: DC | PRN

## 2022-02-27 MED ORDER — ACETAMINOPHEN 650 MG RE SUPP: 650.0000 mg | Freq: Four times a day (QID) | RECTAL | Status: DC | PRN

## 2022-02-27 MED ORDER — ACETAMINOPHEN 500 MG PO TABS
1000.0000 mg | ORAL_TABLET | Freq: Four times a day (QID) | ORAL | Status: DC | PRN
  Administered 2022-03-01: 1000 mg via ORAL
  Filled 2022-02-27: qty 2

## 2022-02-27 NOTE — Progress Notes (Signed)
Pharmacy Antibiotic Note ? ?George Chavez is a 71 y.o. male admitted on 02/27/2022 presenting with abdominal pain, hx stage IV prostate cancer, concern for sepsis.  Pharmacy has been consulted for vancomycin and cefepime dosing. ? ?Plan: ?Vancomycin '1000mg'$  IV x 1, then '750mg'$  IV q 24h (eAUC 498, Goal AUC 400-550, SCr used 0.8) ?Cefepime 2g IV q 12 hours ?Monitor renal function, cx and clinical progression to narrow ? ? ?  ? ?Temp (24hrs), Avg:97.5 ?F (36.4 ?C), Min:97.5 ?F (36.4 ?C), Max:97.5 ?F (36.4 ?C) ? ?Recent Labs  ?Lab 02/27/22 ?1507  ?WBC 50.5*  ?CREATININE 0.76  ?  ?CrCl cannot be calculated (Unknown ideal weight.).   ? ?No Known Allergies ? ?Bertis Ruddy, PharmD ?Clinical Pharmacist ?ED Pharmacist Phone # [phone] ?02/27/2022 4:21 PM ? ?

## 2022-02-27 NOTE — Assessment & Plan Note (Signed)
Follows with [facility] oncology, Dr. Charisse March.  On active treatment with Doxetaxol, Zometa, Neulasta and started on Nubeqa on 3/17. ?

## 2022-02-27 NOTE — Hospital Course (Signed)
George Chavez is a 71 y.o. male with medical history significant for stage IV prostate cancer with diffuse osseous metastases and hyperlipidemia who is admitted with hypotension and abdominal pain due to severe constipation. ?

## 2022-02-27 NOTE — Assessment & Plan Note (Addendum)
Severe opioid associated constipation ?Last full bowel movement approximately 10 days prior to admission.  Has been taking daily Senokot and tried MiraLAX over the weekend without benefit. ?-Give Fleet enema ?-Continue scheduled Senokot and MiraLAX ?-Given glycerin suppository, Dulcolax suppository as needed ?-Continue IV fluid hydration ?-May benefit from [facility] in the future ?

## 2022-02-27 NOTE — ED Provider Triage Note (Signed)
Emergency Medicine Provider Triage Evaluation Note ? ?George Chavez , a 71 y.o. male  was evaluated in triage.  Pt complains of constipation. He states that he has not had a bowel movement in 5 days. Patient does have prostate cancer, on chemo for same.  He had a CT scan on Friday at [facility] which showed large stool burden, no other acute findings.  He was given MiraLAX and Ex-Lax which he took 1 of each without success.  States he has been eating and drinking normally, denies nausea or vomiting.  No abdominal pain or tenderness. ? ?Review of Systems  ?Positive: Constipation ?Negative: Fever, chills, nausea, vomiting, abdominal pain. ? ?Physical Exam  ?BP (!) 118/92 (BP Location: Left Arm)   Pulse 91   Temp (!) 97.5 ?F (36.4 ?C) (Oral)   Resp 16   SpO2 96%  ?Gen:   Awake, no distress   ?Resp:  Normal effort  ?MSK:   Moves extremities without difficulty  ?Other:  Abdomen soft nontender ? ?Medical Decision Making  ?Medically screening exam initiated at 3:18 PM.  Appropriate orders placed.  George Chavez was informed that the remainder of the evaluation will be completed by another provider, this initial triage assessment does not replace that evaluation, and the importance of remaining in the ED until their evaluation is complete. ? ? ?  ?Bud Face, PA-C ?02/27/22 1521 ? ?

## 2022-02-27 NOTE — ED Provider Notes (Signed)
?[place] ?Provider Note ? ? ?CSN: [id number] ?Arrival date & time: 02/27/22  1441 ? ?  ? ?History ? ?Chief Complaint  ?Patient presents with  ? Constipation  ? ? ?George Chavez is a 71 y.o. male with a history of active stage IV prostate cancer presenting today with constipation and abdominal pain.  Has not passed stool in 7 days.  Notes decreased appetite and the urge to defecate, but doesn't feel it will pass.  Has never sought medical treatment for constipation prior.  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  Denies N/V, diarrhea, fever, recent illness.  Tried Miralax and Ex-lax without relief.  Currently receiving chemotherapy and transdermal medication to increase his WBC.  Patch is placed after chemotherapy, left on for 24 hours, and then removed.  Most recent removal was Friday evening at 8:30 PM.  Hx of prior CVA.  Followed by [facility]. ? ?The history is provided by the patient and medical records.  ?Constipation ?Associated symptoms: abdominal pain (Diffuse)   ? ?  ? ?Home Medications ?Prior to Admission medications   ?Medication Sig Start Date End Date Taking? Authorizing Provider  ?gabapentin (NEURONTIN) 100 MG capsule Take 200 mg by mouth 2 (two) times daily. 02/05/22  Yes [provider]  ?Multiple Vitamins-Minerals (MULTIVITAMIN ADULT) CHEW Chew 1 tablet by mouth daily.   Yes [provider]  ?oxyCODONE (OXY IR/ROXICODONE) 5 MG immediate release tablet Take 5 mg by mouth in the morning and at bedtime. 11/19/21  Yes [provider]  ?polyethylene glycol (MIRALAX / GLYCOLAX) 17 g packet Take 17 g by mouth daily.   Yes [provider]  ?   ? ?Allergies    ?Patient has no known allergies.   ? ?Review of Systems   ?Review of Systems  ?Gastrointestinal:  Positive for abdominal pain (Diffuse) and constipation.  ? ?Physical Exam ?Updated Vital Signs ?BP 118/70   Pulse 92   Temp (!) 97.5 ?F (36.4 ?C) (Oral)   Resp 14    SpO2 97%  ?Physical Exam ?Vitals and nursing note reviewed.  ?Constitutional:   ?   General: He is not in acute distress. ?   Appearance: He is well-developed. He is cachectic. He is not ill-appearing.  ?HENT:  ?   Head: Normocephalic and atraumatic.  ?Eyes:  ?   Conjunctiva/sclera: Conjunctivae normal.  ?Cardiovascular:  ?   Rate and Rhythm: Normal rate and regular rhythm.  ?   Pulses:     ?     Radial pulses are 1+ on the right side and 1+ on the left side.  ?     Dorsalis pedis pulses are 1+ on the right side and 1+ on the left side.  ?   Heart sounds: Normal heart sounds. No murmur heard. ?Pulmonary:  ?   Effort: Pulmonary effort is normal. No respiratory distress.  ?   Breath sounds: Normal breath sounds.  ?Abdominal:  ?   General: Abdomen is scaphoid. Bowel sounds are increased.  ?   Palpations: Abdomen is soft.  ?   Tenderness: There is generalized abdominal tenderness.  ?Musculoskeletal:     ?   General: No swelling.  ?   Cervical back: Neck supple.  ?   Right lower leg: No edema.  ?   Left lower leg: No edema.  ?Skin: ?   General: Skin is warm and dry.  ?   Capillary Refill: Capillary refill takes less than 2  seconds.  ?Neurological:  ?   General: No focal deficit present.  ?   Mental Status: He is alert and oriented to person, place, and time.  ?Psychiatric:     ?   Mood and Affect: Mood normal.     ?   Behavior: Behavior is cooperative.  ? ? ?ED Results / Procedures / Treatments   ?Labs ?(all labs ordered are listed, but only abnormal results are displayed) ?Labs Reviewed  ?COMPREHENSIVE METABOLIC PANEL - Abnormal; Notable for the following components:  ?    Result Value  ? Sodium 133 (*)   ? Glucose, Bld 154 (*)   ? Calcium 7.7 (*)   ? Total Protein 5.5 (*)   ? Albumin 3.1 (*)   ? All other components within normal limits  ?CBC - Abnormal; Notable for the following components:  ? WBC 50.5 (*)   ? Hemoglobin 12.2 (*)   ? HCT 37.2 (*)   ? RDW 22.0 (*)   ? All other components within normal limits   ?LACTIC ACID, PLASMA - Abnormal; Notable for the following components:  ? Lactic Acid, Venous 4.9 (*)   ? All other components within normal limits  ?LACTIC ACID, PLASMA - Abnormal; Notable for the following components:  ? Lactic Acid, Venous 3.1 (*)   ? All other components within normal limits  ?CULTURE, BLOOD (ROUTINE X 2)  ?CULTURE, BLOOD (ROUTINE X 2)  ?URINE CULTURE  ?LIPASE, BLOOD  ?PROTIME-INR  ?APTT  ?URINALYSIS, ROUTINE W REFLEX MICROSCOPIC  ? ? ?EKG ?None ? ?Radiology ?DG Abdomen 1 View ? ?Result Date: 02/27/2022 ?CLINICAL DATA:  Constipation, no bowel movement for 5 days. EXAM: ABDOMEN - 1 VIEW COMPARISON:  None available. FINDINGS: Large volume of colonic stool. Increased small bowel gas without abnormal distension or obstruction. No obvious free air on the supine view. No visualized radiopaque calculi. Included lung bases are clear. IMPRESSION: Large volume of colonic stool, suggesting constipation. No bowel obstruction. Electronically Signed   By: Keith Rake M.D.   On: 02/27/2022 16:04  ? ?DG Chest Port 1 View ? ?Result Date: 02/27/2022 ?CLINICAL DATA:  Questionable sepsis. EXAM: PORTABLE CHEST 1 VIEW COMPARISON:  February 02, 2012 FINDINGS: The heart, hila, and mediastinum are unchanged and unremarkable. No pneumothorax. Mild opacity in left base is somewhat streaky in appearance. No other acute abnormalities. IMPRESSION: Mild opacity in left base is favored represent atelectasis. Early developing infiltrate considered less likely. No other abnormalities or changes. Electronically Signed   By: Dorise Bullion III M.D.   On: 02/27/2022 17:25   ? ?Procedures ?Marland KitchenCritical Care ?Performed by: Prince Rome, PA-C ?Authorized by: Prince Rome, PA-C  ? ?Critical care provider statement:  ?  Critical care time (minutes):  45 ?  Critical care was necessary to treat or prevent imminent or life-threatening deterioration of the following conditions:  Shock and sepsis ?  Critical care was time  spent personally by me on the following activities:  Development of treatment plan with patient or surrogate, discussions with consultants, evaluation of patient's response to treatment, examination of patient, ordering and review of laboratory studies, ordering and review of radiographic studies, ordering and performing treatments and interventions, pulse oximetry, re-evaluation of patient's condition, review of old charts, blood draw for specimens and obtaining history from patient or surrogate ?  Care discussed with: admitting provider    ? ? ?Medications Ordered in ED ?Medications  ?ceFEPIme (MAXIPIME) 2 g in sodium chloride 0.9 % 100 mL IVPB (0  g Intravenous Stopped 02/27/22 1720)  ?vancomycin (VANCOREADY) IVPB 750 mg/150 mL (has no administration in time range)  ?lactated ringers bolus 1,000 mL (0 mLs Intravenous Stopped 02/27/22 1750)  ?vancomycin (VANCOCIN) IVPB 1000 mg/200 mL premix (0 mg Intravenous Stopped 02/27/22 1750)  ?Glycerin (Adult) 2 g suppository 1 suppository (1 suppository Rectal Given 02/27/22 1747)  ?lactated ringers bolus 1,000 mL (1,000 mLs Intravenous New Bag/Given 02/27/22 1830)  ? ? ?ED Course/ Medical Decision Making/ A&P ?  ?                        ?Medical Decision Making ?Amount and/or Complexity of Data Reviewed ?External Data Reviewed: notes. ?Labs: ordered. Decision-making details documented in ED Course. ?Radiology: ordered and independent interpretation performed. Decision-making details documented in ED Course. ?ECG/medicine tests: ordered and independent interpretation performed. Decision-making details documented in ED Course. ? ?Risk ?OTC drugs. ?Prescription drug management. ?Decision regarding hospitalization. ? ?Critical Care ?Total time providing critical care: 45 minutes ? ?71 y.o. male presents to the ED for concern of Constipation ? Marland Kitchen  This involves an extensive number of treatment options, and is a complaint that carries with it a high risk of complications and morbidity.   The differential diagnosis includes constipation, bowel perforation, sepsis, shock ? ?Comorbidities that complicate the patient evaluation include stage 4 pancreatic cancer ? ?Additional history obtained from i

## 2022-02-27 NOTE — Assessment & Plan Note (Signed)
Likely related to decreased oral intake in setting of poor appetite and severe constipation. ?-Continue IV fluid hydration overnight ?

## 2022-02-27 NOTE — Assessment & Plan Note (Signed)
Hold further narcotics for now given severe constipation.  Start lidocaine patch, oral Tylenol, IV Toradol 15 mg as needed. ?

## 2022-02-27 NOTE — ED Triage Notes (Signed)
Daughter-in-law reports pt just finished last chemo for prostate cancer on Friday.  Reports no bowel movement x 5 days.  Pt had a CT on Friday that showed constipation.  Reports abd cramping today.  Given Miralax yesterday and today.  Took Ex-lax today. ?

## 2022-02-27 NOTE — Assessment & Plan Note (Signed)
WBC up to 50 from 12.3 on 3/17.  Most likely secondary to Neulasta OBI given 2 days prior to admit.  Clinically no signs/symptoms of active infection.  Hold further antibiotics for now. ?

## 2022-02-27 NOTE — H&P (Signed)
?History and Physical  ? ? ?BHARAT ANTILLON HEN:[id number] DOB: 11-Oct-1951 DOA: 02/27/2022 ? ?PCP: [facility]  ?Patient coming from: Home ? ?I have personally briefly reviewed patient's old medical records in [place] ? ?Chief Complaint: Abdominal pain ? ?HPI: ?George Chavez is a 71 y.o. male with medical history significant for stage IV prostate cancer with diffuse osseous metastases and hyperlipidemia who presented to the ED for evaluation of abdominal pain. ? ?Patient follows with [facility]ology for management of his prostate cancer.  He is on active treatment with last on 3/17 receiving docetaxel, Zometa, and Neulasta OBI.  He was also started on oral antiandrogen Nubeqa same day. ? ?Imaging performed 3/17: ?1.  CT chest/abdomen/pelvis showed diffuse osseous metastatic disease similar to prior, improving bibasilar groundglass consolidative opacities, large amount of colonic and rectal stool. ?2.  Bone scan showed redemonstrated multifocal osseous metastatic disease in the axial and appendicular skeleton. ? ?Patient states that he has been on chronic pain medication with Oxy IR since October.  He has also been taking Senokot twice daily for bowel regimen.  He has however been severely constipated with last full bowel movement 10 days ago.  He had very small amount of stool out a few days ago.  He has been having progressive abdominal pain.  He tried MiraLAX last few days without benefit.  Appetite has been low and he has not been eating or drinking as much.  He had some nausea without emesis.  He has occasional dyspnea when his pain flares up.  He denies any chest pain, fevers, diaphoresis, dysuria, obvious bleeding. ? ?ED Course  Labs/Imaging on admission: I have personally reviewed following labs and imaging studies. ? ?Initial vitals showed BP 118/92, pulse 91, RR 16, temp 97.5 ?F, SPO2 96% on room air.  While in the ED patient was hypotensive as low as 70/57, improved after IV  fluids. ? ?Labs show WBC 50.5 (previously 12.3 on 02/25/2022 before Neulasta), hemoglobin 12.2, platelets 349,000, sodium 133, potassium 4.4, bicarb 22, BUN 13, creatinine 0.76, serum glucose 154, LFTs within normal limits, lipase 23, lactic acid 4.9 > 3.1. ? ?Blood cultures in process. ? ?Abdominal x-ray shows large volume of colonic stool without bowel obstruction. ? ?Portable chest x-ray shows mild opacity in left lung base favored to represent atelectasis. ? ?Patient was given 2 L LR, IV vancomycin and cefepime, glycerin suppository.  The hospitalist service was consulted to admit for further evaluation and management. ? ?Review of Systems: All systems reviewed and are negative except as documented in history of present illness above. ? ? ?Past Medical History:  ?Diagnosis Date  ? Prostate cancer [facility])   ? ? ?History reviewed. No pertinent surgical history. ? ?Social History: ? reports that he has been smoking cigarettes. He has been smoking an average of .25 packs per day. He has never used smokeless tobacco. He reports that he does not currently use alcohol. He reports that he does not use drugs. ? ?No Known Allergies ? ?History reviewed. No pertinent family history. ? ? ?Prior to Admission medications   ?Medication Sig Start Date End Date Taking? Authorizing Provider  ?gabapentin (NEURONTIN) 100 MG capsule Take 200 mg by mouth 2 (two) times daily. 02/05/22  Yes [provider]  ?Multiple Vitamins-Minerals (MULTIVITAMIN ADULT) CHEW Chew 1 tablet by mouth daily.   Yes [provider]  ?oxyCODONE (OXY IR/ROXICODONE) 5 MG immediate release tablet Take 5 mg by mouth in the morning and at bedtime.  11/19/21  Yes [provider]  ?polyethylene glycol (MIRALAX / GLYCOLAX) 17 g packet Take 17 g by mouth daily.   Yes [provider]  ? ? ?Physical Exam: ?Vitals:  ? 02/27/22 1930 02/27/22 2006 02/27/22 2015 02/27/22 2113  ?BP: '91/65 94/63 96/66 '$ 91/67  ?Pulse: 91 97 92 88  ?Resp: 15 (!) '30  15 15  '$ ?Temp:    98.3 ?F (36.8 ?C)  ?TempSrc:    Oral  ?SpO2: 98% 97% 98% 99%  ?Weight:    45.1 kg  ?Height:    '5\' 8"'$  (1.727 m)  ? ?Constitutional: Thin man resting supine in bed, calm, intermittently wincing in pain ?Eyes: PERRL, lids and conjunctivae normal ?ENMT: Mucous membranes are dry. Posterior pharynx clear of any exudate or lesions.Normal dentition.  ?Neck: normal, supple, no masses. ?Respiratory: clear to auscultation bilaterally, no wheezing, no crackles. Normal respiratory effort. No accessory muscle use.  ?Cardiovascular: Regular rate and rhythm, no murmurs / rubs / gallops. No extremity edema. 2+ pedal pulses. ?Abdomen: Mild generalized tenderness, no masses palpated. No hepatosplenomegaly. Bowel sounds present.  ?Musculoskeletal: no clubbing / cyanosis. No joint deformity upper and lower extremities. Good ROM, no contractures. Normal muscle tone.  ?Skin: no rashes, lesions, ulcers. No induration ?Neurologic: CN 2-12 grossly intact. Sensation intact. Strength 5/5 in all 4.  ?Psychiatric: Normal judgment and insight. Alert and oriented x 3. Normal mood.  ? ?EKG: Personally reviewed. Normal sinus rhythm without acute ischemic changes.  Similar to prior. ? ?Assessment/Plan ?Principal Problem: ?  Abdominal pain ?Active Problems: ?  Constipation due to opioid therapy ?  Leukocytosis ?  Hypotension ?  Prostate cancer metastatic to bone [facility]) ?  Cancer associated pain ?  ?George Chavez is a 71 y.o. male with medical history significant for stage IV prostate cancer with diffuse osseous metastases and hyperlipidemia who is admitted with hypotension and abdominal pain due to severe constipation. ? ?Assessment and Plan: ?* Abdominal pain ?Severe opioid associated constipation ?Last full bowel movement approximately 10 days prior to admission.  Has been taking daily Senokot and tried MiraLAX over the weekend without benefit. ?-Give Fleet enema ?-Continue scheduled Senokot and MiraLAX ?-Given glycerin  suppository, Dulcolax suppository as needed ?-Continue IV fluid hydration ?-May benefit from [facility] in the future ? ?Hypotension ?Likely related to decreased oral intake in setting of poor appetite and severe constipation. ?-Continue IV fluid hydration overnight ? ?Leukocytosis ?WBC up to 50 from 12.3 on 3/17.  Most likely secondary to Neulasta OBI given 2 days prior to admit.  Clinically no signs/symptoms of active infection.  Hold further antibiotics for now. ? ?Cancer associated pain ?Hold further narcotics for now given severe constipation.  Start lidocaine patch, oral Tylenol, IV Toradol 15 mg as needed. ? ?Prostate cancer metastatic to bone [facility]) ?Follows with [facility]ology, Dr. Charisse March.  On active treatment with Doxetaxol, Zometa, Neulasta and started on Nubeqa on 3/17. ? ?DVT prophylaxis: enoxaparin (LOVENOX) injection 30 mg Start: 02/27/22 2000 ?Code Status: Partial code (no CPR, intubation okay if necessary) ?Family Communication: Discussed with patient's daughter-in-law at bedside ?Disposition Plan: From home and likely discharge to home pending clinical progress ?Consults called: None ?Severity of Illness: ?The appropriate patient status for this patient is OBSERVATION. Observation status is judged to be reasonable and necessary in order to provide the required intensity of service to ensure the patient's safety. The patient's presenting symptoms, physical exam findings, and initial radiographic and laboratory data in the context of their medical condition is felt  to place them at decreased risk for further clinical deterioration. Furthermore, it is anticipated that the patient will be medically stable for discharge from the hospital within 2 midnights of admission.   ?Zada Finders MD ?[facility] ? ?If 7PM-7AM, please contact night-coverage ?[email or web address] ? ?02/27/2022, 10:53 PM  ?

## 2022-02-28 DIAGNOSIS — G893 Neoplasm related pain (acute) (chronic): Secondary | ICD-10-CM | POA: Diagnosis not present

## 2022-02-28 DIAGNOSIS — R1084 Generalized abdominal pain: Secondary | ICD-10-CM | POA: Diagnosis not present

## 2022-02-28 DIAGNOSIS — E861 Hypovolemia: Secondary | ICD-10-CM

## 2022-02-28 DIAGNOSIS — K5903 Drug induced constipation: Secondary | ICD-10-CM

## 2022-02-28 DIAGNOSIS — I9589 Other hypotension: Secondary | ICD-10-CM | POA: Diagnosis not present

## 2022-02-28 DIAGNOSIS — T402X5A Adverse effect of other opioids, initial encounter: Secondary | ICD-10-CM

## 2022-02-28 LAB — CBC WITH DIFFERENTIAL/PLATELET
Abs Immature Granulocytes: 1.5 10*3/uL — ABNORMAL HIGH (ref 0.00–0.07)
Basophils Absolute: 0 10*3/uL (ref 0.0–0.1)
Basophils Relative: 0 %
Eosinophils Absolute: 0 10*3/uL (ref 0.0–0.5)
Eosinophils Relative: 0 %
HCT: 33.8 % — ABNORMAL LOW (ref 39.0–52.0)
Hemoglobin: 11 g/dL — ABNORMAL LOW (ref 13.0–17.0)
Lymphocytes Relative: 1 %
Lymphs Abs: 0.5 10*3/uL — ABNORMAL LOW (ref 0.7–4.0)
MCH: 26.1 pg (ref 26.0–34.0)
MCHC: 32.5 g/dL (ref 30.0–36.0)
MCV: 80.3 fL (ref 80.0–100.0)
Monocytes Absolute: 0.5 10*3/uL (ref 0.1–1.0)
Monocytes Relative: 1 %
Myelocytes: 3 %
Neutro Abs: 46.5 10*3/uL — ABNORMAL HIGH (ref 1.7–7.7)
Neutrophils Relative %: 95 %
Platelets: 259 10*3/uL (ref 150–400)
RBC: 4.21 MIL/uL — ABNORMAL LOW (ref 4.22–5.81)
RDW: 21.4 % — ABNORMAL HIGH (ref 11.5–15.5)
WBC: 48.9 10*3/uL — ABNORMAL HIGH (ref 4.0–10.5)
nRBC: 0 % (ref 0.0–0.2)
nRBC: 0 /100 WBC

## 2022-02-28 LAB — URINALYSIS, ROUTINE W REFLEX MICROSCOPIC
Bilirubin Urine: NEGATIVE
Glucose, UA: NEGATIVE mg/dL
Hgb urine dipstick: NEGATIVE
Ketones, ur: NEGATIVE mg/dL
Leukocytes,Ua: NEGATIVE
Nitrite: NEGATIVE
Protein, ur: NEGATIVE mg/dL
Specific Gravity, Urine: 1.01 (ref 1.005–1.030)
pH: 7 (ref 5.0–8.0)

## 2022-02-28 LAB — BASIC METABOLIC PANEL
Anion gap: 6 (ref 5–15)
BUN: 11 mg/dL (ref 8–23)
CO2: 24 mmol/L (ref 22–32)
Calcium: 7.3 mg/dL — ABNORMAL LOW (ref 8.9–10.3)
Chloride: 103 mmol/L (ref 98–111)
Creatinine, Ser: 0.67 mg/dL (ref 0.61–1.24)
GFR, Estimated: >60 mL/min (ref >60)
Glucose, Bld: 156 mg/dL — ABNORMAL HIGH (ref 70–99)
Potassium: 4.3 mmol/L (ref 3.5–5.1)
Sodium: 133 mmol/L — ABNORMAL LOW (ref 135–145)

## 2022-02-28 LAB — MAGNESIUM: Magnesium: 2 mg/dL (ref 1.7–2.4)

## 2022-02-28 LAB — SODIUM, URINE, RANDOM: Sodium, Ur: 83 mmol/L

## 2022-02-28 NOTE — Progress Notes (Signed)
?PROGRESS NOTE ? ? ? ?George Chavez  OHY:[id number] DOB: 1951/05/18 DOA: 02/27/2022 ?PCP: [facility]  ?Outpatient Specialists:  ? ? ? ?Brief Narrative:  ?Patient is a 71 year old African-American male with past medical history significant for metastatic prostate cancer with bone metastasis.  Patient also carries history of hyperlipidemia.  Patient has been on opiates for pain.  Patient presents with abdominal pain.  X-ray done revealed significant constipation/stool load.  Patient has tried senna, MiraLAX, enema and suppository without significant result.  Patient seems to have rectal pain.  Consulted GI team, Dr. Collene Mares to assist with patient's management.  Patient may benefit from endoscopic disimpaction, however, will defer to Dr. Collene Mares.  Document has advised trying Movantik.  Pharmacy has been consulted turning to assist with the dosing. ? ?02/28/2022: Patient was seen alongside patient's nurse and patient's daughter-in-law.  Above history is noted. ? ? ?Assessment & Plan: ?  ?Principal Problem: ?  Abdominal pain ?Active Problems: ?  Constipation due to opioid therapy ?  Prostate cancer metastatic to bone [facility]) ?  Cancer associated pain ?  Leukocytosis ?  Hypotension ? ? ?Abdominal pain ?Severe opioid associated constipation ?-Last full bowel movement approximately 10 days prior to admission. ?-No improvement with senna, MiraLAX, Dulcolax suppository and enema. ?-Patient endorses significant rectal pain.  Patient may not easily pass stool due to the significant rectal pain. ?-Patient may need endoscopic disimpaction, however, will defer to the GI team. ?-GI team has been consulted, Dr. Collene Mares. ?-GI team has advised consulting pharmacy to dose Movantik. ?-Continue supportive care. ?  ?Hypotension ?-Systolic blood pressures 948 mmHg. ?-Low normal blood pressure is likely multifactorial. ?-Patient has been on significant opiates, with poor p.o. intake due to severe constipation. ?-Continue IV fluids as  patient is likely volume depleted. ?-Check urinalysis and urine sodium as well.   ?  ?Leukocytosis ?-WBC up to 50 from 12.3 on 3/17.  Most likely secondary to Neulasta was given given 2 days prior to admission. ?-Continue to monitor WBC. ?-No constitutional symptoms to suggest infection.   ? ?  ?Cancer associated pain ?Continue to hold hold further narcotics for now given severe constipation. ?Continue lidocaine patch for now. ?-Continue oral Tylenol. ?  ?Prostate cancer metastatic to bone [facility]) ?Follows with [facility]ology, Dr. Charisse March.  On active treatment with Doxetaxol, Zometa, Neulasta and started on Nubeqa on 3/17. ?  ? ?DVT prophylaxis: Subcutaneous Lovenox 30 Mg once daily. ?Code Status: Partial. ?Family Communication: Daughter-in-law. ?Disposition Plan: Home eventually ? ? ?Consultants:  ?GI team, Dr. Collene Mares ? ?Procedures:  ?None ? ?Antimicrobials:  ?None ? ? ?Subjective: ?Patient continues to report pain, including, but not limited to rectal pain ? ?Objective: ?Vitals:  ? 02/27/22 2113 02/28/22 0157 02/28/22 0856 02/28/22 1231  ?BP: 91/67 99/66 103/66 106/70  ?Pulse: 88 85 86 90  ?Resp: '15 15 18 18  '$ ?Temp: 98.3 ?F (36.8 ?C) 98.1 ?F (36.7 ?C) 98 ?F (36.7 ?C) 98.1 ?F (36.7 ?C)  ?TempSrc: Oral Oral Oral Oral  ?SpO2: 99% 100% 100% 100%  ?Weight: 45.1 kg     ?Height: '5\' 8"'$  (1.727 m)     ? ? ?Intake/Output Summary (Last 24 hours) at 02/28/2022 1610 ?Last data filed at 02/28/2022 1528 ?Gross per 24 hour  ?Intake 2329.12 ml  ?Output --  ?Net 2329.12 ml  ? ?Filed Weights  ? 02/27/22 1925 02/27/22 2113  ?Weight: 46.3 kg 45.1 kg  ? ? ?Examination: ? ?General exam: Chronically ill looking and cachectic.  Dry buccal mucosa.  Not in significant distress.   ?Respiratory system: Clear to auscultation.  ?Cardiovascular system: S1 & S2  ?Gastrointestinal system: Abdomen is nondistended, soft and nontender. No organomegaly or masses felt. Normal bowel sounds heard. ?Central nervous system: Awake and alert.  Patient moves  all extremities.   ?Extremities: No leg edema. ? ?Data Reviewed: I have personally reviewed following labs and imaging studies ? ?CBC: ?Recent Labs  ?Lab 02/27/22 ?1507 02/28/22 ?0149  ?WBC 50.5* 48.9*  ?NEUTROABS  --  46.5*  ?HGB 12.2* 11.0*  ?HCT 37.2* 33.8*  ?MCV 81.6 80.3  ?PLT 349 259  ? ?Basic Metabolic Panel: ?Recent Labs  ?Lab 02/27/22 ?1507 02/28/22 ?0149  ?NA 133* 133*  ?K 4.4 4.3  ?CL 98 103  ?CO2 22 24  ?GLUCOSE 154* 156*  ?BUN 13 11  ?CREATININE 0.76 0.67  ?CALCIUM 7.7* 7.3*  ?MG  --  2.0  ? ?GFR: ?Estimated Creatinine Clearance: 53.2 mL/min (by C-G formula based on SCr of 0.67 mg/dL). ?Liver Function Tests: ?Recent Labs  ?Lab 02/27/22 ?1507  ?AST 29  ?ALT 11  ?ALKPHOS 126  ?BILITOT 0.9  ?PROT 5.5*  ?ALBUMIN 3.1*  ? ?Recent Labs  ?Lab 02/27/22 ?1507  ?LIPASE 23  ? ?No results for input(s): AMMONIA in the last 168 hours. ?Coagulation Profile: ?Recent Labs  ?Lab 02/27/22 ?1625  ?INR 1.0  ? ?Cardiac Enzymes: ?No results for input(s): CKTOTAL, CKMB, CKMBINDEX, TROPONINI in the last 168 hours. ?BNP (last 3 results) ?No results for input(s): PROBNP in the last 8760 hours. ?HbA1C: ?No results for input(s): HGBA1C in the last 72 hours. ?CBG: ?No results for input(s): GLUCAP in the last 168 hours. ?Lipid Profile: ?No results for input(s): CHOL, HDL, LDLCALC, TRIG, CHOLHDL, LDLDIRECT in the last 72 hours. ?Thyroid Function Tests: ?No results for input(s): TSH, T4TOTAL, FREET4, T3FREE, THYROIDAB in the last 72 hours. ?Anemia Panel: ?No results for input(s): VITAMINB12, FOLATE, FERRITIN, TIBC, IRON, RETICCTPCT in the last 72 hours. ?Urine analysis: ?No results found for: COLORURINE, APPEARANCEUR, [place], [place], [place], [place], [place], KETONESUR, PROTEINUR, [place], NITRITE, LEUKOCYTESUR ?Sepsis Labs: ?'@LABRCNTIP'$ (procalcitonin:4,lacticidven:4) ? ?) ?Recent Results (from the past 240 hour(s))  ?Blood Culture (routine x 2)     Status: None (Preliminary result)  ? Collection Time: 02/27/22  2:41 PM  ?  Specimen: BLOOD  ?Result Value Ref Range Status  ? Specimen Description BLOOD RIGHT ANTECUBITAL  Final  ? Special Requests   Final  ?  BOTTLES DRAWN AEROBIC AND ANAEROBIC Blood Culture adequate volume  ? Culture   Final  ?  NO GROWTH < 24 HOURS ?Performed at [facility], [place] [address], [place], [place] [address] ?  ? Report Status PENDING  Incomplete  ?Blood Culture (routine x 2)     Status: None (Preliminary result)  ? Collection Time: 02/27/22  4:26 PM  ? Specimen: Left Antecubital; Blood  ?Result Value Ref Range Status  ? Specimen Description LEFT ANTECUBITAL  Final  ? Special Requests   Final  ?  BOTTLES DRAWN AEROBIC AND ANAEROBIC Blood Culture adequate volume  ? Culture   Final  ?  NO GROWTH < 24 HOURS ?Performed at [facility], [place] [address] [place], [place] [address] ?  ? Report Status PENDING  Incomplete  ?Resp Panel by RT-PCR (Flu A&B, Covid) Nasopharyngeal Swab     Status: None  ? Collection Time: 02/27/22  8:47 PM  ? Specimen: Nasopharyngeal Swab; Nasopharyngeal(NP) swabs in vial transport medium  ?Result Value Ref Range Status  ? SARS  Coronavirus 2 by RT PCR NEGATIVE NEGATIVE Final  ?  Comment: (NOTE) ?SARS-CoV-2 target nucleic acids are NOT DETECTED. ? ?The SARS-CoV-2 RNA is generally detectable in upper respiratory ?specimens during the acute phase of infection. The lowest ?concentration of SARS-CoV-2 viral copies this assay can detect is ?138 copies/mL. A negative result does not preclude SARS-Cov-2 ?infection and should not be used as the sole basis for treatment or ?other patient management decisions. A negative result may occur with  ?improper specimen collection/handling, submission of specimen other ?than nasopharyngeal swab, presence of viral mutation(s) within the ?areas targeted by this assay, and inadequate number of viral ?copies(<138 copies/mL). A negative result must be combined with ?clinical observations, patient history, and epidemiological ?information. The expected  result is Negative. ? ?Fact Sheet for Patients:  ?EntrepreneurPulse.com.au ? ?Fact Sheet for Healthcare Providers:  ?IncredibleEmployment.be ? ?This test is no t yet appro

## 2022-02-28 NOTE — Consult Note (Addendum)
UNASSIGNED PATIENT Reason for Consult: Severe constipation with rectal pain. Referring Physician: [facility] hospitalist.  George Chavez is an 71 y.o. male.  HPI: [name] is a 71 year old black male with history of metastatic prostate cancer with multiple bone marrow metastasis who is being on opioids for bone pain, has had worse worsening constipation in spite of trying senna, MiraLAX suppositories etc. Patient denies having abdominal pain but complains of see if severe rectal pain. There is no history of melena or hematochezia  Past Medical History:  Diagnosis Date   Prostate cancer (HCC)    History reviewed. No pertinent surgical history.  History reviewed. No pertinent family history.  Social History:  reports that he has been smoking cigarettes. He has been smoking an average of .25 packs per day. He has never used smokeless tobacco. He reports that he does not currently use alcohol. He reports that he does not use drugs.  Allergies: No Known Allergies  Medications: I have reviewed the patient's current medications. Prior to Admission:  Medications Prior to Admission  Medication Sig Dispense Refill Last Dose   gabapentin (NEURONTIN) 100 MG capsule Take 200 mg by mouth 2 (two) times daily.   02/27/2022   Multiple Vitamins-Minerals (MULTIVITAMIN ADULT) CHEW Chew 1 tablet by mouth daily.   02/26/2022   oxyCODONE (OXY IR/ROXICODONE) 5 MG immediate release tablet Take 5 mg by mouth in the morning and at bedtime.   02/27/2022   polyethylene glycol (MIRALAX / GLYCOLAX) 17 g packet Take 17 g by mouth daily.   02/27/2022   Scheduled:  enoxaparin (LOVENOX) injection  30 mg Subcutaneous Q24H   feeding supplement  237 mL Oral BID BM   influenza vaccine adjuvanted  0.5 mL Intramuscular Tomorrow-1000   lidocaine  1 patch Transdermal Q24H   polyethylene glycol  17 g Oral BID   senna  1 tablet Oral BID   Continuous:  Results for orders placed or performed during the hospital encounter  of 02/27/22 (from the past 48 hour(s))  Blood Culture (routine x 2)     Status: None (Preliminary result)   Collection Time: 02/27/22  2:41 PM   Specimen: BLOOD  Result Value Ref Range   Specimen Description BLOOD RIGHT ANTECUBITAL    Special Requests      BOTTLES DRAWN AEROBIC AND ANAEROBIC Blood Culture adequate volume   Culture      NO GROWTH < 24 HOURS Performed at [facility], [address] [place], [place] [address]    Report Status PENDING   Lipase, blood     Status: None   Collection Time: 02/27/22  3:07 PM  Result Value Ref Range   Lipase 23 11 - 51 U/L    Comment: Performed at [facility], [address], [place], [place] [address]  Comprehensive metabolic panel     Status: Abnormal   Collection Time: 02/27/22  3:07 PM  Result Value Ref Range   Sodium 133 (L) 135 - 145 mmol/L   Potassium 4.4 3.5 - 5.1 mmol/L   Chloride 98 98 - 111 mmol/L   CO2 22 22 - 32 mmol/L   Glucose, Bld 154 (H) 70 - 99 mg/dL    Comment: Glucose reference range applies only to samples taken after fasting for at least 8 hours.   BUN 13 8 - 23 mg/dL   Creatinine, Ser 3.08 0.61 - 1.24 mg/dL   Calcium 7.7 (L) 8.9 - 10.3 mg/dL   Total Protein 5.5 (L) 6.5 -  8.1 g/dL   Albumin 3.1 (L) 3.5 - 5.0 g/dL   AST 29 15 - 41 U/L   ALT 11 0 - 44 U/L   Alkaline Phosphatase 126 38 - 126 U/L   Total Bilirubin 0.9 0.3 - 1.2 mg/dL   GFR, Estimated >13 >08 mL/min    Comment: (NOTE) Calculated using the CKD-EPI Creatinine Equation (2021)    Anion gap 13 5 - 15    Comment: Performed at [facility], [address], [place], [place] [address]  CBC     Status: Abnormal   Collection Time: 02/27/22  3:07 PM  Result Value Ref Range   WBC 50.5 (HH) 4.0 - 10.5 K/uL    Comment: REPEATED TO VERIFY THIS CRITICAL RESULT HAS VERIFIED AND BEEN CALLED TO RN ,CAMRON COBB BY NAZERA SHAMA ON 03 19 2023 AT 1539, AND HAS BEEN READ BACK.     RBC 4.56 4.22 - 5.81 MIL/uL   Hemoglobin 12.2 (L) 13.0 - 17.0 g/dL    HCT 69.6 (L) 29.5 - 52.0 %   MCV 81.6 80.0 - 100.0 fL   MCH 26.8 26.0 - 34.0 pg   MCHC 32.8 30.0 - 36.0 g/dL   RDW 28.4 (H) 13.2 - 44.0 %   Platelets 349 150 - 400 K/uL   nRBC 0.0 0.0 - 0.2 %    Comment: Performed at [facility], [address] [place], [place] [address]  Lactic acid, plasma     Status: Abnormal   Collection Time: 02/27/22  4:25 PM  Result Value Ref Range   Lactic Acid, Venous 4.9 (HH) 0.5 - 1.9 mmol/L    Comment: CRITICAL RESULT CALLED TO, READ BACK BY AND VERIFIED WITH: C.COBB RN @ 1715 02/27/2022 BY C.EDENS Performed at [facility], [address] [place], [place] [address]   Protime-INR     Status: None   Collection Time: 02/27/22  4:25 PM  Result Value Ref Range   Prothrombin Time 13.4 11.4 - 15.2 seconds   INR 1.0 0.8 - 1.2    Comment: (NOTE) INR goal varies based on device and disease states. Performed at [facility], [address], [place], [place] [address]   APTT     Status: None   Collection Time: 02/27/22  4:25 PM  Result Value Ref Range   aPTT 27 24 - 36 seconds    Comment: Performed at [facility], [address], [place], [place] [address]  Blood Culture (routine x 2)     Status: None (Preliminary result)   Collection Time: 02/27/22  4:26 PM   Specimen: Left Antecubital; Blood  Result Value Ref Range   Specimen Description LEFT ANTECUBITAL    Special Requests      BOTTLES DRAWN AEROBIC AND ANAEROBIC Blood Culture adequate volume   Culture      NO GROWTH < 24 HOURS Performed at [facility], [address], [place], [place] [address]    Report Status PENDING   Lactic acid, plasma     Status: Abnormal   Collection Time: 02/27/22  6:18 PM  Result Value Ref Range   Lactic Acid, Venous 3.1 (HH) 0.5 - 1.9 mmol/L    Comment: CRITICAL VALUE NOTED.  VALUE IS CONSISTENT WITH PREVIOUSLY REPORTED AND CALLED VALUE. Performed at [facility], [address], [place], [place] [address]   Resp Panel by  RT-PCR (Flu A&B, Covid) Nasopharyngeal Swab     Status:  None   Collection Time: 02/27/22  8:47 PM   Specimen: Nasopharyngeal Swab; Nasopharyngeal(NP) swabs in vial transport medium  Result Value Ref Range   SARS Coronavirus 2 by RT PCR NEGATIVE NEGATIVE    Comment: (NOTE) SARS-CoV-2 target nucleic acids are NOT DETECTED.  The SARS-CoV-2 RNA is generally detectable in upper respiratory specimens during the acute phase of infection. The lowest concentration of SARS-CoV-2 viral copies this assay can detect is 138 copies/mL. A negative result does not preclude SARS-Cov-2 infection and should not be used as the sole basis for treatment or other patient management decisions. A negative result may occur with  improper specimen collection/handling, submission of specimen other than nasopharyngeal swab, presence of viral mutation(s) within the areas targeted by this assay, and inadequate number of viral copies(<138 copies/mL). A negative result must be combined with clinical observations, patient history, and epidemiological information. The expected result is Negative.  Fact Sheet for Patients:  BloggerCourse.com  Fact Sheet for Healthcare Providers:  SeriousBroker.it  This test is no t yet approved or cleared by the [place] FDA and  has been authorized for detection and/or diagnosis of SARS-CoV-2 by FDA under an Emergency Use Authorization (EUA). This EUA will remain  in effect (meaning this test can be used) for the duration of the COVID-19 declaration under Section 564(b)(1) of the Act, 21 U.S.C.section 360bbb-3(b)(1), unless the authorization is terminated  or revoked sooner.       Influenza A by PCR NEGATIVE NEGATIVE   Influenza B by PCR NEGATIVE NEGATIVE    Comment: (NOTE) The Xpert Xpress SARS-CoV-2/FLU/RSV plus assay is intended as an aid in the diagnosis of influenza from Nasopharyngeal swab specimens and should not be  used as a sole basis for treatment. Nasal washings and aspirates are unacceptable for Xpert Xpress SARS-CoV-2/FLU/RSV testing.  Fact Sheet for Patients: BloggerCourse.com  Fact Sheet for Healthcare Providers: SeriousBroker.it  This test is not yet approved or cleared by the [place] FDA and has been authorized for detection and/or diagnosis of SARS-CoV-2 by FDA under an Emergency Use Authorization (EUA). This EUA will remain in effect (meaning this test can be used) for the duration of the COVID-19 declaration under Section 564(b)(1) of the Act, 21 U.S.C. section 360bbb-3(b)(1), unless the authorization is terminated or revoked.  Performed at [facility], [address] [place], [place] [address]   Magnesium     Status: None   Collection Time: 02/28/22  1:49 AM  Result Value Ref Range   Magnesium 2.0 1.7 - 2.4 mg/dL    Comment: Performed at [facility], [address], [place], [place] [address]  Basic metabolic panel     Status: Abnormal   Collection Time: 02/28/22  1:49 AM  Result Value Ref Range   Sodium 133 (L) 135 - 145 mmol/L   Potassium 4.3 3.5 - 5.1 mmol/L   Chloride 103 98 - 111 mmol/L   CO2 24 22 - 32 mmol/L   Glucose, Bld 156 (H) 70 - 99 mg/dL    Comment: Glucose reference range applies only to samples taken after fasting for at least 8 hours.   BUN 11 8 - 23 mg/dL   Creatinine, Ser 8.29 0.61 - 1.24 mg/dL   Calcium 7.3 (L) 8.9 - 10.3 mg/dL   GFR, Estimated >56 >21 mL/min    Comment: (NOTE) Calculated using the CKD-EPI Creatinine Equation (2021)    Anion gap 6 5 - 15    Comment: Performed at [facility], [address] [place], [place] [address]  CBC WITH DIFFERENTIAL     Status: Abnormal   Collection Time: 02/28/22  1:49 AM  Result Value Ref Range   WBC 48.9 (H) 4.0 - 10.5 K/uL   RBC 4.21 (L) 4.22 - 5.81 MIL/uL   Hemoglobin 11.0 (L) 13.0 - 17.0 g/dL   HCT 08.6 (L) 57.8 - 46.9 %    MCV 80.3 80.0 - 100.0 fL   MCH 26.1 26.0 - 34.0 pg   MCHC 32.5 30.0 - 36.0 g/dL   RDW 62.9 (H) 52.8 - 41.3 %   Platelets 259 150 - 400 K/uL   nRBC 0.0 0.0 - 0.2 %   Neutrophils Relative % 95 %   Neutro Abs 46.5 (H) 1.7 - 7.7 K/uL   Lymphocytes Relative 1 %   Lymphs Abs 0.5 (L) 0.7 - 4.0 K/uL   Monocytes Relative 1 %   Monocytes Absolute 0.5 0.1 - 1.0 K/uL   Eosinophils Relative 0 %   Eosinophils Absolute 0.0 0.0 - 0.5 K/uL   Basophils Relative 0 %   Basophils Absolute 0.0 0.0 - 0.1 K/uL   nRBC 0 0 /100 WBC   Myelocytes 3 %   Abs Immature Granulocytes 1.50 (H) 0.00 - 0.07 K/uL   Polychromasia PRESENT    Target Cells PRESENT     Comment: Performed at [facility], [address], [place], [place] [address]  Sodium, urine, random     Status: None   Collection Time: 02/28/22  5:05 PM  Result Value Ref Range   Sodium, Ur 83 mmol/L    Comment: Performed at [facility], [address], [place], [place] [address]  Urinalysis, Routine w reflex microscopic In/Out Cath Urine     Status: None   Collection Time: 02/28/22  5:05 PM  Result Value Ref Range   Color, Urine YELLOW YELLOW   APPearance CLEAR CLEAR   Specific Gravity, Urine 1.010 1.005 - 1.030   pH 7.0 5.0 - 8.0   Glucose, UA NEGATIVE NEGATIVE mg/dL   Hgb urine dipstick NEGATIVE NEGATIVE   Bilirubin Urine NEGATIVE NEGATIVE   Ketones, ur NEGATIVE NEGATIVE mg/dL   Protein, ur NEGATIVE NEGATIVE mg/dL   Nitrite NEGATIVE NEGATIVE   Leukocytes,Ua NEGATIVE NEGATIVE    Comment: Performed at [facility], [address] [place], [place] [address]    DG Abdomen 1 View  Result Date: 02/27/2022 CLINICAL DATA:  Constipation, no bowel movement for 5 days. EXAM: ABDOMEN - 1 VIEW COMPARISON:  None available. FINDINGS: Large volume of colonic stool. Increased small bowel gas without abnormal distension or obstruction. No obvious free air on the supine view. No visualized radiopaque calculi. Included lung bases are clear.  IMPRESSION: Large volume of colonic stool, suggesting constipation. No bowel obstruction. Electronically Signed   By: Narda Rutherford M.D.   On: 02/27/2022 16:04   DG Chest Port 1 View  Result Date: 02/27/2022 CLINICAL DATA:  Questionable sepsis. EXAM: PORTABLE CHEST 1 VIEW COMPARISON:  February 02, 2012 FINDINGS: The heart, hila, and mediastinum are unchanged and unremarkable. No pneumothorax. Mild opacity in left base is somewhat streaky in appearance. No other acute abnormalities. IMPRESSION: Mild opacity in left base is favored represent atelectasis. Early developing infiltrate considered less likely. No other abnormalities or changes. Electronically Signed   By: Gerome Sam III M.D.   On: 02/27/2022 17:25    Review of Systems  Constitutional:  Positive for appetite change and unexpected  weight change.  HENT: Negative.    Eyes: Negative.   Respiratory: Negative.    Cardiovascular: Negative.   Gastrointestinal:  Positive for constipation. Negative for abdominal distention, abdominal pain, anal bleeding and blood in stool.  Endocrine: Negative.   Musculoskeletal:  Positive for arthralgias, back pain and joint swelling.  Allergic/Immunologic: Negative.   Neurological:  Positive for weakness.  Psychiatric/Behavioral:  The patient is nervous/anxious.   Blood pressure 98/65, pulse 87, temperature 98.4 F (36.9 C), temperature source Oral, resp. rate 18, height 5\' 8"  (1.727 m), weight 45.1 kg, SpO2 97 %. Physical Exam Vitals reviewed.  Constitutional:      General: He is in acute distress.     Appearance: He is ill-appearing. He is not diaphoretic.  HENT:     Head: Normocephalic and atraumatic.     Mouth/Throat:     Mouth: Mucous membranes are dry.  Eyes:     Extraocular Movements: Extraocular movements intact.     Conjunctiva/sclera: Conjunctivae normal.     Pupils: Pupils are equal, round, and reactive to light.  Cardiovascular:     Rate and Rhythm: Normal rate and regular  rhythm.  Pulmonary:     Effort: Pulmonary effort is normal.     Breath sounds: Normal breath sounds.  Abdominal:     General: Abdomen is flat. There is no distension.     Palpations: Abdomen is soft. There is no mass.     Tenderness: There is no abdominal tenderness. There is no guarding or rebound.     Hernia: No hernia is present.  Genitourinary:    Comments: Fecal smearing noted; severe perianal tenderness on palpation; patient could not tolerate a DRE; Chaperone-Merilynn RN on 6N  Musculoskeletal:     Cervical back: Neck supple.  Skin:    General: Skin is warm and dry.  Neurological:     Mental Status: He is alert and oriented to person, place, and time.  Psychiatric:        Mood and Affect: Mood normal.        Behavior: Behavior normal.    Assessment/Plan: 1) Severe narcotic associated constipation with severe perirectal pain on palpation -plans are to schedule a pelvic MRI to rule out perianal abscess. Pharmacy is going to try to dose of Movantik for him to see if this helps his symptoms. 2) Metastatic prostate cancer. Charna Elizabeth 02/28/2022, 5:55 PM

## 2022-02-28 NOTE — Progress Notes (Signed)
Fleet enema given. Large stool in colon but patient unable to expel it, says it too painful for him to push or nurse to try to remove it. Will have him rest and check later. ?

## 2022-02-28 NOTE — Progress Notes (Signed)
Mobility Specialist Progress Note: ? ? 02/28/22 1613  ?Mobility  ?Activity Refused mobility  ? ?Pt stated earlier he needed to rest now peacefully sleeping. Will follow-up as time allows.  ? ?Sharif Rendell ?Mobility Specialist ?Primary Phone [phone] ? ?

## 2022-03-01 ENCOUNTER — Observation Stay (HOSPITAL_COMMUNITY): Payer: Medicare HMO

## 2022-03-01 ENCOUNTER — Other Ambulatory Visit (HOSPITAL_COMMUNITY): Payer: Self-pay

## 2022-03-01 DIAGNOSIS — Z20822 Contact with and (suspected) exposure to covid-19: Secondary | ICD-10-CM | POA: Diagnosis present

## 2022-03-01 DIAGNOSIS — Z23 Encounter for immunization: Secondary | ICD-10-CM | POA: Diagnosis present

## 2022-03-01 DIAGNOSIS — Z681 Body mass index (BMI) 19 or less, adult: Secondary | ICD-10-CM | POA: Diagnosis not present

## 2022-03-01 DIAGNOSIS — R1084 Generalized abdominal pain: Secondary | ICD-10-CM | POA: Diagnosis not present

## 2022-03-01 DIAGNOSIS — E869 Volume depletion, unspecified: Secondary | ICD-10-CM | POA: Diagnosis present

## 2022-03-01 DIAGNOSIS — E785 Hyperlipidemia, unspecified: Secondary | ICD-10-CM | POA: Diagnosis present

## 2022-03-01 DIAGNOSIS — K5903 Drug induced constipation: Secondary | ICD-10-CM | POA: Diagnosis present

## 2022-03-01 DIAGNOSIS — T458X5A Adverse effect of other primarily systemic and hematological agents, initial encounter: Secondary | ICD-10-CM | POA: Diagnosis present

## 2022-03-01 DIAGNOSIS — C7951 Secondary malignant neoplasm of bone: Secondary | ICD-10-CM | POA: Diagnosis present

## 2022-03-01 DIAGNOSIS — L89152 Pressure ulcer of sacral region, stage 2: Secondary | ICD-10-CM | POA: Diagnosis present

## 2022-03-01 DIAGNOSIS — E43 Unspecified severe protein-calorie malnutrition: Secondary | ICD-10-CM | POA: Diagnosis present

## 2022-03-01 DIAGNOSIS — R109 Unspecified abdominal pain: Secondary | ICD-10-CM | POA: Diagnosis present

## 2022-03-01 DIAGNOSIS — D72829 Elevated white blood cell count, unspecified: Secondary | ICD-10-CM | POA: Diagnosis present

## 2022-03-01 DIAGNOSIS — C61 Malignant neoplasm of prostate: Secondary | ICD-10-CM | POA: Diagnosis present

## 2022-03-01 DIAGNOSIS — Z79899 Other long term (current) drug therapy: Secondary | ICD-10-CM | POA: Diagnosis not present

## 2022-03-01 DIAGNOSIS — Z79891 Long term (current) use of opiate analgesic: Secondary | ICD-10-CM | POA: Diagnosis not present

## 2022-03-01 DIAGNOSIS — G893 Neoplasm related pain (acute) (chronic): Secondary | ICD-10-CM | POA: Diagnosis present

## 2022-03-01 DIAGNOSIS — I9589 Other hypotension: Secondary | ICD-10-CM | POA: Diagnosis not present

## 2022-03-01 DIAGNOSIS — T402X5A Adverse effect of other opioids, initial encounter: Secondary | ICD-10-CM | POA: Diagnosis present

## 2022-03-01 DIAGNOSIS — F17210 Nicotine dependence, cigarettes, uncomplicated: Secondary | ICD-10-CM | POA: Diagnosis present

## 2022-03-01 DIAGNOSIS — E872 Acidosis, unspecified: Secondary | ICD-10-CM | POA: Diagnosis present

## 2022-03-01 DIAGNOSIS — K6289 Other specified diseases of anus and rectum: Secondary | ICD-10-CM | POA: Diagnosis not present

## 2022-03-01 LAB — RENAL FUNCTION PANEL
Albumin: 2.1 g/dL — ABNORMAL LOW (ref 3.5–5.0)
Anion gap: 9 (ref 5–15)
BUN: 10 mg/dL (ref 8–23)
CO2: 23 mmol/L (ref 22–32)
Calcium: 7.3 mg/dL — ABNORMAL LOW (ref 8.9–10.3)
Chloride: 102 mmol/L (ref 98–111)
Creatinine, Ser: 0.5 mg/dL — ABNORMAL LOW (ref 0.61–1.24)
GFR, Estimated: >60 mL/min (ref >60)
Glucose, Bld: 62 mg/dL — ABNORMAL LOW (ref 70–99)
Phosphorus: 1.6 mg/dL — ABNORMAL LOW (ref 2.5–4.6)
Potassium: 3.9 mmol/L (ref 3.5–5.1)
Sodium: 134 mmol/L — ABNORMAL LOW (ref 135–145)

## 2022-03-01 MED ORDER — POTASSIUM CHLORIDE IN NACL 20-0.9 MEQ/L-% IV SOLN
INTRAVENOUS | Status: DC
  Filled 2022-03-01 (×4): qty 1000

## 2022-03-01 MED ORDER — NALOXEGOL OXALATE 25 MG PO TABS
25.0000 mg | ORAL_TABLET | Freq: Every day | ORAL | Status: DC
  Administered 2022-03-01 – 2022-03-02 (×2): 25 mg via ORAL
  Filled 2022-03-01 (×2): qty 1

## 2022-03-01 MED ORDER — FLEET ENEMA 7-19 GM/118ML RE ENEM
2.0000 | ENEMA | Freq: Once | RECTAL | Status: AC
  Administered 2022-03-01: 2 via RECTAL
  Filled 2022-03-01: qty 2

## 2022-03-01 MED ORDER — ENSURE ENLIVE PO LIQD
237.0000 mL | Freq: Three times a day (TID) | ORAL | Status: DC
  Administered 2022-03-02 – 2022-03-04 (×4): 237 mL via ORAL

## 2022-03-01 MED ORDER — GADOBUTROL 1 MMOL/ML IV SOLN
4.5000 mL | Freq: Once | INTRAVENOUS | Status: AC | PRN
  Administered 2022-03-01: 4.5 mL via INTRAVENOUS

## 2022-03-01 MED ORDER — POTASSIUM PHOSPHATES 15 MMOLE/5ML IV SOLN
30.0000 mmol | Freq: Once | INTRAVENOUS | Status: AC
  Administered 2022-03-01: 30 mmol via INTRAVENOUS
  Filled 2022-03-01: qty 10

## 2022-03-01 MED ORDER — SIMETHICONE 80 MG PO CHEW
80.0000 mg | CHEWABLE_TABLET | Freq: Once | ORAL | Status: AC
  Administered 2022-03-01: 80 mg via ORAL
  Filled 2022-03-01: qty 1

## 2022-03-01 MED ORDER — ADULT MULTIVITAMIN W/MINERALS CH
1.0000 | ORAL_TABLET | Freq: Every day | ORAL | Status: DC
  Administered 2022-03-01 – 2022-03-04 (×3): 1 via ORAL
  Filled 2022-03-01 (×3): qty 1

## 2022-03-01 NOTE — Progress Notes (Signed)
?PROGRESS NOTE ? ? ? ?George Chavez  FXT:[id number] DOB: November 23, 1951 DOA: 02/27/2022 ?PCP: [facility]  ?Outpatient Specialists:  ? ? ? ?Brief Narrative:  ?Patient is a 71 year old African-American male with past medical history significant for metastatic prostate cancer with bone metastasis.  Patient also carries history of hyperlipidemia.  Patient has been on opiates for pain.  Patient presents with abdominal pain.  X-ray done revealed significant constipation/stool load.  Patient has tried senna, MiraLAX, enema and suppository without significant result.  Patient seems to have rectal pain.  Consulted GI team, Dr. Collene Mares to assist with patient's management.  Patient may benefit from endoscopic disimpaction, however, will defer to Dr. Collene Mares.  Document has advised trying Movantik.  Pharmacy has been consulted turning to assist with the dosing. ? ?03/01/2022: Patient seen.  Patient looks better today.  Rectal pain has improved.  MRI of the pelvis was negative for rectal abscess.  Fecal impaction is noted.  Patient has had a dose of Movantik.  Patient is on senna and MiraLAX.  GI input is appreciated.  No significant stool.  I suspect overflow loose stools. ? ?Assessment & Plan: ?  ?Principal Problem: ?  Abdominal pain ?Active Problems: ?  Constipation due to opioid therapy ?  Leukocytosis ?  Hypotension ?  Prostate cancer metastatic to bone [facility]) ?  Cancer associated pain ? ? ?Abdominal pain ?Severe opioid associated constipation ?-Last full bowel movement approximately 10 days prior to admission. ?-No improvement with senna, MiraLAX, Dulcolax suppository and enema. ?-Patient has had Movantik. ?-GI input is appreciated. ?-MRI pelvis is negative for perirectal abscess. ?-Fecal impaction is noted. ?-Continue senna and MiraLAX. ?-Continue enema as needed. ?-Patient may need endoscopic disimpaction, however, will defer to the GI team. ?-GI team has been consulted, Dr. Collene Mares. ?-Continue supportive care. ?   ?Hypotension ?-Systolic blood pressures 299 mmHg. ?-Low normal blood pressure is likely multifactorial. ?-Patient has been on significant opiates, with poor p.o. intake due to severe constipation. ?-Continue IV fluids as patient is likely volume depleted.   ?  ?Leukocytosis ?-WBC up to 50 from 12.3 on 3/17.  Most likely secondary to Neulasta was given given 2 days prior to admission. ?-Continue to monitor WBC. ?-No constitutional symptoms to suggest infection.   ? ?  ?Cancer associated pain ?Continue to hold hold further narcotics for now given severe constipation. ?Continue lidocaine patch for now. ?-Continue oral Tylenol. ?  ?Prostate cancer metastatic to bone [facility]) ?Follows with [facility] oncology, Dr. Charisse March.  On active treatment with Doxetaxol, Zometa, Neulasta and started on Nubeqa on 3/17. ?  ? ?DVT prophylaxis: Subcutaneous Lovenox 30 Mg once daily. ?Code Status: Partial. ?Family Communication: Daughter-in-law. ?Disposition Plan: Home eventually ? ? ?Consultants:  ?GI team, Dr. Collene Mares ? ?Procedures:  ?None ? ?Antimicrobials:  ?None ? ? ?Subjective: ?Patient remains constipated.  No significant bowel output.   ? ?Objective: ?Vitals:  ? 02/28/22 2021 03/01/22 0411 03/01/22 0805 03/01/22 1613  ?BP: (!) 85/53 (!) 145/128 96/70 (!) 99/59  ?Pulse: 95 80 93 82  ?Resp: '16 15 17 17  '$ ?Temp: 98.8 ?F (37.1 ?C) (!) 97.5 ?F (36.4 ?C) 97.8 ?F (36.6 ?C) 98.3 ?F (36.8 ?C)  ?TempSrc: Oral Oral    ?SpO2: 100% 100% 100% 100%  ?Weight:      ?Height:      ? ? ?Intake/Output Summary (Last 24 hours) at 03/01/2022 1734 ?Last data filed at 03/01/2022 0631 ?Gross per 24 hour  ?Intake --  ?Output 600 ml  ?Net -600  ml  ? ? ?Filed Weights  ? 02/27/22 1925 02/27/22 2113  ?Weight: 46.3 kg 45.1 kg  ? ? ?Examination: ? ?General exam: Chronically ill looking and cachectic.  Dry buccal mucosa.  Not in significant distress.   ?Respiratory system: Clear to auscultation.  ?Cardiovascular system: S1 & S2  ?Gastrointestinal system: Abdomen is  nondistended, soft and nontender. No organomegaly or masses felt. Normal bowel sounds heard. ?Central nervous system: Awake and alert.  Patient moves all extremities.   ?Extremities: No leg edema. ? ?Data Reviewed: I have personally reviewed following labs and imaging studies ? ?CBC: ?Recent Labs  ?Lab 02/27/22 ?1507 02/28/22 ?0149  ?WBC 50.5* 48.9*  ?NEUTROABS  --  46.5*  ?HGB 12.2* 11.0*  ?HCT 37.2* 33.8*  ?MCV 81.6 80.3  ?PLT 349 259  ? ? ?Basic Metabolic Panel: ?Recent Labs  ?Lab 02/27/22 ?1507 02/28/22 ?0149 03/01/22 ?0353  ?NA 133* 133* 134*  ?K 4.4 4.3 3.9  ?CL 98 103 102  ?CO2 '22 24 23  '$ ?GLUCOSE 154* 156* 62*  ?BUN '13 11 10  '$ ?CREATININE 0.76 0.67 0.50*  ?CALCIUM 7.7* 7.3* 7.3*  ?MG  --  2.0  --   ?PHOS  --   --  1.6*  ? ? ?GFR: ?Estimated Creatinine Clearance: 53.2 mL/min (A) (by C-G formula based on SCr of 0.5 mg/dL (L)). ?Liver Function Tests: ?Recent Labs  ?Lab 02/27/22 ?1507 03/01/22 ?0353  ?AST 29  --   ?ALT 11  --   ?ALKPHOS 126  --   ?BILITOT 0.9  --   ?PROT 5.5*  --   ?ALBUMIN 3.1* 2.1*  ? ? ?Recent Labs  ?Lab 02/27/22 ?1507  ?LIPASE 23  ? ? ?No results for input(s): AMMONIA in the last 168 hours. ?Coagulation Profile: ?Recent Labs  ?Lab 02/27/22 ?1625  ?INR 1.0  ? ? ?Cardiac Enzymes: ?No results for input(s): CKTOTAL, CKMB, CKMBINDEX, TROPONINI in the last 168 hours. ?BNP (last 3 results) ?No results for input(s): PROBNP in the last 8760 hours. ?HbA1C: ?No results for input(s): HGBA1C in the last 72 hours. ?CBG: ?No results for input(s): GLUCAP in the last 168 hours. ?Lipid Profile: ?No results for input(s): CHOL, HDL, LDLCALC, TRIG, CHOLHDL, LDLDIRECT in the last 72 hours. ?Thyroid Function Tests: ?No results for input(s): TSH, T4TOTAL, FREET4, T3FREE, THYROIDAB in the last 72 hours. ?Anemia Panel: ?No results for input(s): VITAMINB12, FOLATE, FERRITIN, TIBC, IRON, RETICCTPCT in the last 72 hours. ?Urine analysis: ?   ?Component Value Date/Time  ? [place] YELLOW 02/28/2022 1705  ? APPEARANCEUR  CLEAR 02/28/2022 1705  ? LABSPEC 1.010 02/28/2022 1705  ? PHURINE 7.0 02/28/2022 1705  ? GLUCOSEU NEGATIVE 02/28/2022 1705  ? [place] NEGATIVE 02/28/2022 1705  ? [place] NEGATIVE 02/28/2022 1705  ? [place] NEGATIVE 02/28/2022 1705  ? PROTEINUR NEGATIVE 02/28/2022 1705  ? NITRITE NEGATIVE 02/28/2022 1705  ? LEUKOCYTESUR NEGATIVE 02/28/2022 1705  ? ?Sepsis Labs: ?'@LABRCNTIP'$ (procalcitonin:4,lacticidven:4) ? ?) ?Recent Results (from the past 240 hour(s))  ?Blood Culture (routine x 2)     Status: None (Preliminary result)  ? Collection Time: 02/27/22  2:41 PM  ? Specimen: BLOOD  ?Result Value Ref Range Status  ? Specimen Description BLOOD RIGHT ANTECUBITAL  Final  ? Special Requests   Final  ?  BOTTLES DRAWN AEROBIC AND ANAEROBIC Blood Culture adequate volume  ? Culture   Final  ?  NO GROWTH 2 DAYS ?Performed at [facility], [place] [address] [place], [place] [address] ?  ? Report Status PENDING  Incomplete  ?  Blood Culture (routine x 2)     Status: None (Preliminary result)  ? Collection Time: 02/27/22  4:26 PM  ? Specimen: Left Antecubital; Blood  ?Result Value Ref Range Status  ? Specimen Description LEFT ANTECUBITAL  Final  ? Special Requests   Final  ?  BOTTLES DRAWN AEROBIC AND ANAEROBIC Blood Culture adequate volume  ? Culture   Final  ?  NO GROWTH 2 DAYS ?Performed at [facility], [place] [address] [place], [place] [address] ?  ? Report Status PENDING  Incomplete  ?Resp Panel by RT-PCR (Flu A&B, Covid) Nasopharyngeal Swab     Status: None  ? Collection Time: 02/27/22  8:47 PM  ? Specimen: Nasopharyngeal Swab; Nasopharyngeal(NP) swabs in vial transport medium  ?Result Value Ref Range Status  ? SARS Coronavirus 2 by RT PCR NEGATIVE NEGATIVE Final  ?  Comment: (NOTE) ?SARS-CoV-2 target nucleic acids are NOT DETECTED. ? ?The SARS-CoV-2 RNA is generally detectable in upper respiratory ?specimens during the acute phase of infection. The lowest ?concentration of SARS-CoV-2 viral copies this assay can detect  is ?138 copies/mL. A negative result does not preclude SARS-Cov-2 ?infection and should not be used as the sole basis for treatment or ?other patient management decisions. A negative result may occur with  ?impr

## 2022-03-01 NOTE — Progress Notes (Signed)
Initial Nutrition Assessment ? ?DOCUMENTATION CODES:  ?Underweight, Severe malnutrition in context of chronic illness ? ?INTERVENTION:  ?Advance diet as able to regular ?Encourage PO intake ?Increase Ensure Enlive po to TID, each supplement provides 350 kcal and 20 grams of protein. ?Magic cup TID with meals, each supplement provides 290 kcal and 9 grams of protein ?MVI with minerals daily ?High risk for refeeding, monitor electrolytes and replace as needed for at least 48 hours. ? ?NUTRITION DIAGNOSIS:  ?Severe Malnutrition (in the context of chronic illness) related to poor appetite as evidenced by severe fat depletion, severe muscle depletion, percent weight loss, energy intake < 75% for > or equal to 1 month (21.7% weight loss x 6 months). ? ?GOAL:  ?Patient will meet greater than or equal to 90% of their needs ? ?MONITOR:  ?PO intake, Labs, Supplement acceptance ? ?REASON FOR ASSESSMENT:  ?Malnutrition Screening Tool ?  ? ?ASSESSMENT:  ?71 y.o. male with hx of stage IV prostate cancer with mets to the bone (most recent chemo received 3/17 at [facility]) and HLD presented to ED with significant abdominal pain related to constipation. ? ?Significant stool burden noted on imaging. Pt having rectal and abdominal pain on admission. Movantik initiated. Aggressive bowel regimen being implemented. GI consulted for possible disimpaction.  ? ?Pt resting in bed at the time of assessment. Continues to have significant rectal pain related to his constipation. Pt reports a difficult year with his wife passing 04/2021 and then being dx with cancer 06/2021. Currently pt is living with family. Functional status has declined markedly. ? ?Discussed recent nutrition hx with pt. Reports that he hasn't had much PO at all in the last 4-5 days. Has not had an appetite and has felt bloated and full. Prior to that, pt reports eating 1x/d. States that he would eat something small like a sausage biscuit and would not have anything else other than  items such as a pack of nabs, or a small bag of chips the rest of the day. Discussed elevated needs with chemo and the importance of increasing his intake. Pt reports that his last chemo infusion was this past Friday 3/17.  ? ?Pt with severe muscle and fat depletions. Very little reserve left on his body. 21.7% weight loss noted in the last 6 months (9/13-3/19) which is also severe. ? ?Discussed nutrition supplements with pt. Does not drink them at home, but understands their usefulness in providing nutrition. Prefers strawberry. Will also add magic cup to meal trays. Pt would benefit from placement of g-tube as it is highly unlikely he has the stamina to replenish his extreme loss of adipose and muscle stores through PO intake alone.  ? ?Reviewed labs and electrolytes suggestive of refeeding syndrome. Pt at high risk due to poor intake and severe malnutrition. Phosphorus being replaced today. Would recommend assessing for a minimum of 48 hours and repletion of phosphorus, potassium, and Mg as needed. ? ?Nutritionally Relevant Medications: ?Scheduled Meds: ? Ensure Enlive  237 mL Oral BID BM  ? naloxegol oxalate  25 mg Oral Daily  ? polyethylene glycol  17 g Oral BID  ? senna  1 tablet Oral BID  ? ?PRN Meds: bisacodyl, ondansetron  ? ?Labs Reviewed: ?Sodium 134 ?Glucose 62 ?Creatinine .5 ?Phosphorus 1.6  ? ?NUTRITION - FOCUSED PHYSICAL EXAM: ?Flowsheet Row Most Recent Value  ?Orbital Region Severe depletion  ?Upper Arm Region Severe depletion  ?Thoracic and Lumbar Region Severe depletion  ?Buccal Region Severe depletion  ?Temple Region Severe depletion  ?  Clavicle Bone Region Severe depletion  ?Clavicle and Acromion Bone Region Severe depletion  ?Scapular Bone Region Severe depletion  ?Dorsal Hand Severe depletion  ?Patellar Region Severe depletion  ?Anterior Thigh Region Severe depletion  ?Posterior Calf Region Severe depletion  ?Edema (RD Assessment) None  ?Hair Reviewed  ?Eyes Reviewed  ?Mouth Reviewed  ?Skin  Reviewed  ?Nails Reviewed  ? ?Diet Order:   ?Diet Order   ? ?       ?  Diet full liquid Room service appropriate? Yes; Fluid consistency: Thin  Diet effective now       ?  ? ?  ?  ? ?  ? ? ?EDUCATION NEEDS:  ?Education needs have been addressed ? ?Skin:  Skin Assessment: Reviewed RN Assessment (stage 2 pressure injury to the mid sacrum) ? ?Last BM:  3/20 - type 5 ? ?Height:  ?Ht Readings from Last 1 Encounters:  ?02/27/22 '5\' 8"'$  (1.727 m)  ? ?Weight:  ?Wt Readings from Last 1 Encounters:  ?02/27/22 45.1 kg  ? ? ?Ideal Body Weight:  70 kg ? ?BMI:  Body mass index is 15.12 kg/m?. ? ?Estimated Nutritional Needs:  ?Kcal:  1600-1800 kcal/d ?Protein:  80-90 g/d ?Fluid:  >/= 1.8 L/d ? ?Ranell Lovern, RD, LDN ?Clinical Dietitian ?RD pager # available in [place]  ?After hours/weekend pager # available in [place] ?

## 2022-03-01 NOTE — TOC Benefit Eligibility Note (Signed)
Patient Advocate Encounter ? ?Insurance verification completed.   ? ?The patient is currently admitted and upon discharge could be taking Movantik 25 mg tablets. ? ?The current 30 day co-pay is, $45.00.  ? ?The patient is insured through [employer] Part D  ? ? ? ?Lyndel Safe, CPhT ?Pharmacy Patient Advocate Specialist ?[place] Patient Advocate Team ?Direct Number: [phone]  Fax: [phone] ? ? ? ? ? ?  ?

## 2022-03-01 NOTE — Progress Notes (Signed)
Mobility Specialist Progress Note: ? ? 03/01/22 1041  ?Mobility  ?Activity Ambulated with assistance in room;Transferred from bed to chair  ?Level of Assistance Contact guard assist, steadying assist  ?Assistive Device Front wheel walker  ?Distance Ambulated (ft) 6 ft  ?Activity Response Tolerated well  ?$Mobility charge 1 Mobility  ? ?Pt received in bed willing to participate in mobility. Complaints of 5/10 abdominal pain. Required minA for bed mobility.  Pt had small BM in bed requiring peri-care. Left in chair with call bell in reach and all needs met.  ? ?Teanna Elem ?Mobility Specialist ?Primary Phone [phone] ? ?

## 2022-03-01 NOTE — Progress Notes (Signed)
Subjective: ?Still with proctalgia. ? ?Objective: ?Vital signs in last 24 hours: ?Temp:  [97.5 ?F (36.4 ?C)-98.8 ?F (37.1 ?C)] 97.8 ?F (36.6 ?C) (03/21 0805) ?Pulse Rate:  [80-95] 93 (03/21 0805) ?Resp:  [15-18] 17 (03/21 0805) ?BP: (85-145)/(53-128) 96/70 (03/21 0805) ?SpO2:  [97 %-100 %] 100 % (03/21 0805) ?  ? ?Intake/Output from previous day: ?03/20 0701 - 03/21 0700 ?In: 44 [P.O.:120] ?Out: 600 [Urine:600] ?Intake/Output this shift: ?No intake/output data recorded. ? ?General appearance: uncomfortable ?GI: soft, non-tender; bowel sounds normal; no masses,  no organomegaly ? ?Lab Results: ?Recent Labs  ?  02/27/22 ?1507 02/28/22 ?0149  ?WBC 50.5* 48.9*  ?HGB 12.2* 11.0*  ?HCT 37.2* 33.8*  ?PLT 349 259  ? ?BMET ?Recent Labs  ?  02/27/22 ?1507 02/28/22 ?0149 03/01/22 ?0353  ?NA 133* 133* 134*  ?K 4.4 4.3 3.9  ?CL 98 103 102  ?CO2 '22 24 23  '$ ?GLUCOSE 154* 156* 62*  ?BUN '13 11 10  '$ ?CREATININE 0.76 0.67 0.50*  ?CALCIUM 7.7* 7.3* 7.3*  ? ?LFT ?Recent Labs  ?  02/27/22 ?1507 03/01/22 ?0353  ?PROT 5.5*  --   ?ALBUMIN 3.1* 2.1*  ?AST 29  --   ?ALT 11  --   ?ALKPHOS 126  --   ?BILITOT 0.9  --   ? ?PT/INR ?Recent Labs  ?  02/27/22 ?1625  ?LABPROT 13.4  ?INR 1.0  ? ?Hepatitis Panel ?No results for input(s): HEPBSAG, HCVAB, HEPAIGM, HEPBIGM in the last 72 hours. ?C-Diff ?No results for input(s): CDIFFTOX in the last 72 hours. ?Fecal Lactopherrin ?No results for input(s): FECLLACTOFRN in the last 72 hours. ? ?Studies/Results: ?DG Abdomen 1 View ? ?Result Date: 02/27/2022 ?CLINICAL DATA:  Constipation, no bowel movement for 5 days. EXAM: ABDOMEN - 1 VIEW COMPARISON:  None available. FINDINGS: Large volume of colonic stool. Increased small bowel gas without abnormal distension or obstruction. No obvious free air on the supine view. No visualized radiopaque calculi. Included lung bases are clear. IMPRESSION: Large volume of colonic stool, suggesting constipation. No bowel obstruction. Electronically Signed   By: Keith Rake  M.D.   On: 02/27/2022 16:04  ? ?MR PELVIS W WO CONTRAST ? ?Result Date: 03/01/2022 ?CLINICAL DATA:  Severe rectal pain. Constipation. Suspected perianal fistula. Metastatic prostate carcinoma. EXAM: MRI PELVIS WITHOUT AND WITH CONTRAST TECHNIQUE: Multiplanar multisequence MR imaging of the pelvis was performed both before and after administration of intravenous contrast. CONTRAST:  4.39m GADAVIST GADOBUTROL 1 MMOL/ML IV SOLN COMPARISON:  None. FINDINGS: Lower Urinary Tract: Urinary bladder is distended but otherwise unremarkable in appearance. Bowel: The rectum is distended with stool. There is mild diffuse rectal wall thickening and enhancement, which could be due to stercoral or radiation proctitis. No evidence perianal fistula or abscess. Vascular/Lymphatic: No pathologically enlarged lymph nodes or other significant abnormality seen in lower pelvis. Reproductive: Prior prostatectomy. No mass identified within the surgical bed. Other: Diffuse edema is seen with throughout the pelvic and perineal soft tissues. No focal fluid collection identified. A 2 cm focus of susceptibility artifact is seen in the superficial right perineal soft tissues, which could represent artifact from a metallic object, calcification, or air. Musculoskeletal: Diffuse bone metastases are seen throughout the pelvis and hips. IMPRESSION: Rectum is distended with stool. Mild diffuse rectal wall thickening and enhancement, which could be due to stercoral or radiation proctitis. No evidence of perianal fistula or abscess. Diffuse pelvic and perineal soft tissue edema. 2 cm focus of susceptibility artifact in the right perineal soft tissues is of  indeterminate etiology, and could represent a metallic object, calcification, or air. Recommend physical inspection of this area, and consider correlation with pelvis CT. Prior prostatectomy. Diffuse bone metastases throughout the pelvis and hips. Electronically Signed   By: Marlaine Hind M.D.   On:  03/01/2022 13:44  ? ?DG Chest Port 1 View ? ?Result Date: 02/27/2022 ?CLINICAL DATA:  Questionable sepsis. EXAM: PORTABLE CHEST 1 VIEW COMPARISON:  February 02, 2012 FINDINGS: The heart, hila, and mediastinum are unchanged and unremarkable. No pneumothorax. Mild opacity in left base is somewhat streaky in appearance. No other acute abnormalities. IMPRESSION: Mild opacity in left base is favored represent atelectasis. Early developing infiltrate considered less likely. No other abnormalities or changes. Electronically Signed   By: Dorise Bullion III M.D.   On: 02/27/2022 17:25   ? ?Medications: Scheduled: ? enoxaparin (LOVENOX) injection  30 mg Subcutaneous Q24H  ? feeding supplement  237 mL Oral TID BM  ? lidocaine  1 patch Transdermal Q24H  ? multivitamin with minerals  1 tablet Oral Daily  ? naloxegol oxalate  25 mg Oral Daily  ? polyethylene glycol  17 g Oral BID  ? senna  1 tablet Oral BID  ? ?Continuous: ? 0.9 % NaCl with KCl 20 mEq / L    ? potassium PHOSPHATE IVPB (in mmol) 30 mmol (03/01/22 1305)  ? ? ?Assessment/Plan: ?1) Possible fecal impaction. ?2) Metastatic prostate cancer. ? ? The pelvic MRI did not show any evidence of a perirectal abscess.  There is a large stool burden and this can be cause his pain. ? ?Plan: ?1) Fleets enema. ?2) Continue with Miralas and Senna. ? LOS: 0 days  ? ?Lamone Ferrelli,[place] D ?03/01/2022, 3:17 PM  ?

## 2022-03-01 NOTE — Progress Notes (Signed)
Inpatient Diabetes Program Recommendations ? ?AACE/ADA: New Consensus Statement on Inpatient Glycemic Control (2015) ? ?Target Ranges:  Prepandial:   less than 140 mg/dL ?     Peak postprandial:   less than 180 mg/dL (1-2 hours) ?     Critically ill patients:  140 - 180 mg/dL  ? ?No results found for: GLUCAP, HGBA1C ? ?Review of Glycemic Control ? Latest Reference Range & Units 02/27/22 15:07 02/28/22 01:49 03/01/22 03:53  ?Glucose 70 - 99 mg/dL 154 (H) 156 (H) 62 (L)  ?(H): Data is abnormally high ?(L): Data is abnormally low ? ?Diabetes history: DM2 ?Outpatient Diabetes medications: None ?Current orders for Inpatient glycemic control: None ? ?Inpatient Diabetes Program Recommendations:   ? ?Please order CBG's ac/hs.  Per PCP's note on 01/28/22 patient has DM2.  A1C was 5.1% on 01/05/22. ? ?Will continue to follow while inpatient. ? ?Thank you, ?Reche Dixon, MSN, RN ?Diabetes Coordinator ?Inpatient Diabetes Program ?[phone] (team pager from 8a-5p) ? ? ? ? ?

## 2022-03-02 DIAGNOSIS — L8990 Pressure ulcer of unspecified site, unspecified stage: Secondary | ICD-10-CM | POA: Insufficient documentation

## 2022-03-02 DIAGNOSIS — E43 Unspecified severe protein-calorie malnutrition: Secondary | ICD-10-CM | POA: Insufficient documentation

## 2022-03-02 DIAGNOSIS — R1084 Generalized abdominal pain: Secondary | ICD-10-CM | POA: Diagnosis not present

## 2022-03-02 LAB — CBC WITH DIFFERENTIAL/PLATELET
Abs Immature Granulocytes: 0.3 10*3/uL — ABNORMAL HIGH (ref 0.00–0.07)
Basophils Absolute: 0 10*3/uL (ref 0.0–0.1)
Basophils Relative: 0 %
Eosinophils Absolute: 0 10*3/uL (ref 0.0–0.5)
Eosinophils Relative: 0 %
HCT: 28 % — ABNORMAL LOW (ref 39.0–52.0)
Hemoglobin: 9.2 g/dL — ABNORMAL LOW (ref 13.0–17.0)
Lymphocytes Relative: 1 %
Lymphs Abs: 0.2 10*3/uL — ABNORMAL LOW (ref 0.7–4.0)
MCH: 26.4 pg (ref 26.0–34.0)
MCHC: 32.9 g/dL (ref 30.0–36.0)
MCV: 80.2 fL (ref 80.0–100.0)
Monocytes Absolute: 0 10*3/uL — ABNORMAL LOW (ref 0.1–1.0)
Monocytes Relative: 0 %
Myelocytes: 2 %
Neutro Abs: 14.7 10*3/uL — ABNORMAL HIGH (ref 1.7–7.7)
Neutrophils Relative %: 97 %
Platelets: 217 10*3/uL (ref 150–400)
RBC: 3.49 MIL/uL — ABNORMAL LOW (ref 4.22–5.81)
RDW: 21.2 % — ABNORMAL HIGH (ref 11.5–15.5)
WBC: 15.2 10*3/uL — ABNORMAL HIGH (ref 4.0–10.5)
nRBC: 0 % (ref 0.0–0.2)
nRBC: 0 /100 WBC

## 2022-03-02 LAB — RENAL FUNCTION PANEL
Albumin: 2.3 g/dL — ABNORMAL LOW (ref 3.5–5.0)
Anion gap: 7 (ref 5–15)
BUN: 10 mg/dL (ref 8–23)
CO2: 22 mmol/L (ref 22–32)
Calcium: 7.1 mg/dL — ABNORMAL LOW (ref 8.9–10.3)
Chloride: 104 mmol/L (ref 98–111)
Creatinine, Ser: 0.53 mg/dL — ABNORMAL LOW (ref 0.61–1.24)
GFR, Estimated: >60 mL/min (ref >60)
Glucose, Bld: 84 mg/dL (ref 70–99)
Phosphorus: 2.2 mg/dL — ABNORMAL LOW (ref 2.5–4.6)
Potassium: 4.5 mmol/L (ref 3.5–5.1)
Sodium: 133 mmol/L — ABNORMAL LOW (ref 135–145)

## 2022-03-02 LAB — URINE CULTURE: Culture: NO GROWTH

## 2022-03-02 MED ORDER — NALOXEGOL OXALATE 12.5 MG PO TABS
12.5000 mg | ORAL_TABLET | Freq: Every day | ORAL | Status: DC
  Administered 2022-03-04: 12.5 mg via ORAL
  Filled 2022-03-02 (×2): qty 1

## 2022-03-02 MED ORDER — FLEET ENEMA 7-19 GM/118ML RE ENEM
2.0000 | ENEMA | Freq: Once | RECTAL | Status: AC
  Administered 2022-03-03: 2 via RECTAL
  Filled 2022-03-02: qty 2

## 2022-03-02 NOTE — TOC Initial Note (Signed)
Transition of Care (TOC) - Initial/Assessment Note  ? ? ?Patient Details  ?Name: George Chavez ?MRN: [id number] ?Date of Birth: 01-26-1951 ? ?Transition of Care (TOC) CM/SW Contact:    ?Marilu Favre, RN ?Phone Number: ?03/02/2022, 11:54 AM ? ?Clinical Narrative:                 ?Received a call from Marjory Lies with [facility] home [facility]. Patient active with [facility] for HHPT and [place] [facility] [place] requesting resumption of care orders.  ? ?Discussed with patient. Patient lives with son and daughter in law and would like to continue with [facility]. Patient has walker at home already  ?Entered orders for MD signature ?Expected Discharge Plan: [place] ?Barriers to Discharge: Continued Medical Work up ? ? ?Patient Goals and CMS Choice ?Patient states their goals for this hospitalization and ongoing recovery are:: to return to home ?CMS Medicare.gov Compare Post Acute Care list provided to:: Patient ?Choice offered to / list presented to : Patient ? ?Expected Discharge Plan and Services ?Expected Discharge Plan: [place] ?  ?Discharge Planning Services: CM Consult ?Post Acute Care Choice: Home Health ?Living arrangements for the past 2 months: [place] ?                ?DME Arranged: N/A ?DME Agency: NA ?  ?  ?  ?HH Arranged: PT, OT ?[place] Agency: [facility] ?Date HH Agency Contacted: 03/02/22 ?Time [place]: 1153 ?Representative spoke with at [place]: Marjory Lies ? ?Prior Living Arrangements/Services ?Living arrangements for the past 2 months: [place] ?Lives with:: Adult Children ?Patient language and need for interpreter reviewed:: Yes ?Do you feel safe going back to the place where you live?: Yes      ?Need for Family Participation in Patient Care: Yes (Comment) ?Care giver support system in place?: Yes (comment) ?Current home services: DME ?Criminal Activity/Legal Involvement Pertinent to Current Situation/Hospitalization: No - Comment as  needed ? ?Activities of Daily Living ?Home Assistive Devices/Equipment: Eyeglasses, Wheelchair, Environmental consultant (specify type) ?ADL Screening (condition at time of admission) ?Patient's cognitive ability adequate to safely complete daily activities?: Yes ?Is the patient deaf or have difficulty hearing?: No ?Does the patient have difficulty seeing, even when wearing glasses/contacts?: No ?Does the patient have difficulty concentrating, remembering, or making decisions?: No ?Patient able to express need for assistance with ADLs?: Yes ?Does the patient have difficulty dressing or bathing?: Yes ?Independently performs ADLs?: No ?Communication: Independent ?Dressing (OT): Needs assistance ?Is this a change from baseline?: Pre-admission baseline ?Grooming: Needs assistance ?Is this a change from baseline?: Pre-admission baseline ?Feeding: Independent ?Bathing: Needs assistance ?Is this a change from baseline?: Pre-admission baseline ?Toileting: Needs assistance ?Is this a change from baseline?: Pre-admission baseline ?In/Out Bed: Needs assistance ?Is this a change from baseline?: Pre-admission baseline ?Walks in Home: Needs assistance ?Is this a change from baseline?: Pre-admission baseline ?Does the patient have difficulty walking or climbing stairs?: Yes ?Weakness of Legs: Both ?Weakness of Arms/Hands: None ? ?Permission Sought/Granted ?  ?Permission granted to share information with : No ?   ?   ?   ?   ? ?Emotional Assessment ?Appearance:: Appears stated age ?Attitude/Demeanor/Rapport: Engaged ?Affect (typically observed): Accepting ?Orientation: : Oriented to Self, Oriented to Place, Oriented to  Time, Oriented to Situation ?Alcohol / Substance Use: Not Applicable ?Psych Involvement: No (comment) ? ?Admission diagnosis:  Abdominal pain [R10.9] ?Constipation, unspecified constipation type [K59.00] ?Prostate cancer metastatic to bone ([place]) [C61,  C79.51] ?Patient Active Problem List  ? Diagnosis Date Noted  ? Abdominal pain  02/27/2022  ? Constipation due to opioid therapy 02/27/2022  ? Prostate cancer metastatic to bone [facility])   ? Cancer associated pain   ? Leukocytosis   ? Hypotension   ? ?PCP:  [facility] ?Pharmacy:   ?[facility] [place] ?[place] ?[place] [address] ?Phone: [phone] Fax: [phone] ? ?[id number] [address], [address] ?[place] ?[place] [address] ?Phone: [phone] Fax: [phone] ? ? ? ? ?Social Determinants of Health (SDOH) Interventions ?  ? ?Readmission Risk Interventions ?   ? View : No data to display.  ?  ?  ?  ? ? ? ?

## 2022-03-02 NOTE — Progress Notes (Signed)
Mobility Specialist Progress Note: ? ? 03/02/22 1051  ?Mobility  ?Activity Transferred from bed to chair;Ambulated with assistance in room;Stood at bedside  ?Level of Assistance Standby assist, set-up cues, supervision of patient - no hands on  ?Assistive Device Front wheel walker  ?Distance Ambulated (ft) 6 ft  ?Activity Response Tolerated well  ?$Mobility charge 1 Mobility  ? ?Pt received in bed willing to participate in mobility. Complaints of pain d/t fecal impaction. Left in chair with call bell in reach and all needs met.  ? ?George Chavez ?Mobility Specialist ?Primary Phone [phone] ? ?

## 2022-03-02 NOTE — Progress Notes (Signed)
UNASSIGNED PATIENT ?Subjective: ?Patient claims he is feeling much better today and the rectal pain is improved however he may have a stercoral ulcer as rectal wall thickening is noted on the MRI done recently.  He feels the Movantik is made a significant difference in the symptoms. ? ?Objective: ?Vital signs in last 24 hours: ?Temp:  [97.8 ?F (36.6 ?C)-98.7 ?F (37.1 ?C)] 98.4 ?F (36.9 ?C) (03/22 0021) ?Pulse Rate:  [82-93] 82 (03/22 0021) ?Resp:  [16-17] 17 (03/22 0021) ?BP: (96-116)/(59-74) 99/62 (03/22 0021) ?SpO2:  [100 %] 100 % (03/22 0021) ?  ?Intake/Output from previous day: ?03/21 0701 - 03/22 0700 ?In: 545.5 [I.V.:129.3; IV Piggyback:416.3] ?Out: 800 [Urine:800] ?Intake/Output this shift: ?No intake/output data recorded. ? ?General appearance: cooperative, appears stated age, fatigued, and no distress ?Resp: clear to auscultation bilaterally ?Cardio: regular rate and rhythm, S1, S2 normal, no murmur, click, rub or gallop ?GI: soft, non-tender; bowel sounds normal; no masses,  no organomegaly ? ?Lab Results: ?Recent Labs  ?  02/27/22 ?1507 02/28/22 ?0149 03/02/22 ?0263  ?WBC 50.5* 48.9* 15.2*  ?HGB 12.2* 11.0* 9.2*  ?HCT 37.2* 33.8* 28.0*  ?PLT 349 259 217  ? ?BMET ?Recent Labs  ?  02/28/22 ?0149 03/01/22 ?0353 03/02/22 ?0219  ?NA 133* 134* 133*  ?K 4.3 3.9 4.5  ?CL 103 102 104  ?CO2 '24 23 22  '$ ?GLUCOSE 156* 62* 84  ?BUN '11 10 10  '$ ?CREATININE 0.67 0.50* 0.53*  ?CALCIUM 7.3* 7.3* 7.1*  ? ?LFT ?Recent Labs  ?  02/27/22 ?1507 03/01/22 ?0353 03/02/22 ?7858  ?PROT 5.5*  --   --   ?ALBUMIN 3.1*   < > 2.3*  ?AST 29  --   --   ?ALT 11  --   --   ?ALKPHOS 126  --   --   ?BILITOT 0.9  --   --   ? < > = values in this interval not displayed.  ? ?PT/INR ?Recent Labs  ?  02/27/22 ?1625  ?LABPROT 13.4  ?INR 1.0  ? ?Hepatitis Panel ?No results for input(s): HEPBSAG, HCVAB, HEPAIGM, HEPBIGM in the last 72 hours. ?C-Diff ?No results for input(s): CDIFFTOX in the last 72 hours. ?No results for input(s): CDIFFPCR in the last 72  hours. ?Fecal Lactopherrin ?No results for input(s): FECLLACTOFRN in the last 72 hours. ? ?Studies/Results: ?MR PELVIS W WO CONTRAST ? ?Result Date: 03/01/2022 ?CLINICAL DATA:  Severe rectal pain. Constipation. Suspected perianal fistula. Metastatic prostate carcinoma. EXAM: MRI PELVIS WITHOUT AND WITH CONTRAST TECHNIQUE: Multiplanar multisequence MR imaging of the pelvis was performed both before and after administration of intravenous contrast. CONTRAST:  4.76m GADAVIST GADOBUTROL 1 MMOL/ML IV SOLN COMPARISON:  None. FINDINGS: Lower Urinary Tract: Urinary bladder is distended but otherwise unremarkable in appearance. Bowel: The rectum is distended with stool. There is mild diffuse rectal wall thickening and enhancement, which could be due to stercoral or radiation proctitis. No evidence perianal fistula or abscess. Vascular/Lymphatic: No pathologically enlarged lymph nodes or other significant abnormality seen in lower pelvis. Reproductive: Prior prostatectomy. No mass identified within the surgical bed. Other: Diffuse edema is seen with throughout the pelvic and perineal soft tissues. No focal fluid collection identified. A 2 cm focus of susceptibility artifact is seen in the superficial right perineal soft tissues, which could represent artifact from a metallic object, calcification, or air. Musculoskeletal: Diffuse bone metastases are seen throughout the pelvis and hips. IMPRESSION: Rectum is distended with stool. Mild diffuse rectal wall thickening and enhancement, which could be due to  stercoral or radiation proctitis. No evidence of perianal fistula or abscess. Diffuse pelvic and perineal soft tissue edema. 2 cm focus of susceptibility artifact in the right perineal soft tissues is of indeterminate etiology, and could represent a metallic object, calcification, or air. Recommend physical inspection of this area, and consider correlation with pelvis CT. Prior prostatectomy. Diffuse bone metastases throughout  the pelvis and hips. Electronically Signed   By: Marlaine Hind M.D.   On: 03/01/2022 13:44   ? ?Medications: I have reviewed the patient's current medications. ?Prior to Admission:  ?Medications Prior to Admission  ?Medication Sig Dispense Refill Last Dose  ? gabapentin (NEURONTIN) 100 MG capsule Take 200 mg by mouth 2 (two) times daily.   02/27/2022  ? Multiple Vitamins-Minerals (MULTIVITAMIN ADULT) CHEW Chew 1 tablet by mouth daily.   02/26/2022  ? oxyCODONE (OXY IR/ROXICODONE) 5 MG immediate release tablet Take 5 mg by mouth in the morning and at bedtime.   02/27/2022  ? polyethylene glycol (MIRALAX / GLYCOLAX) 17 g packet Take 17 g by mouth daily.   02/27/2022  ? ?Scheduled: ? enoxaparin (LOVENOX) injection  30 mg Subcutaneous Q24H  ? feeding supplement  237 mL Oral TID BM  ? lidocaine  1 patch Transdermal Q24H  ? multivitamin with minerals  1 tablet Oral Daily  ? [START ON 03/03/2022] naloxegol oxalate  12.5 mg Oral Daily  ? polyethylene glycol  17 g Oral BID  ? senna  1 tablet Oral BID  ? ?Continuous: ? 0.9 % NaCl with KCl 20 mEq / L 50 mL/hr at 03/02/22 1106  ? ? ?Assessment/Plan: ?1) Abnormal imaging with thickening of the rectal wall on recent MRI of the pelvis-we will plan to do flexible sigmoidoscopy to further evaluate the patient's symptoms he will be prepped with 2 fleets enemas tomorrow morning; he has agreed to do this. ?2) Metastatic prostate cancer with extensive bone mets. ? LOS: 1 day  ? ?Cordai Rodrigue ?03/02/2022, 7:01 AM ? ? ?

## 2022-03-02 NOTE — Progress Notes (Signed)
?PROGRESS NOTE ? ? ? ?CREWS MCCOLLAM  PHX:[id number] DOB: 25-Jul-1951 DOA: 02/27/2022 ?PCP: [facility]  ? ?Brief Narrative: ?Patient is a 71 year old African-American male with past medical history significant for metastatic prostate cancer with bone metastasis.  Patient also carries history of hyperlipidemia.  Patient has been on opiates for pain.  Patient presents with abdominal pain.  X-ray done revealed significant constipation/stool load. Patient has tried senna, MiraLAX, enema and suppository without significant result.  Patient seems to have rectal pain.  Consulted GI team, Dr. Collene Mares to assist with patient's management.  Patient may benefit from endoscopic disimpaction, however, will defer to Dr. Collene Mares.  GI has advised trying Movantik. Pharmacy has been consulted turning to assist with the dosing.  MRI of the pelvis negative for rectal abscess.  Fecal impaction is noted.  Patient has 2 bowel movements following having Movantik ? ?Assessment & Plan: ?  ?Principal Problem: ?  Abdominal pain ?Active Problems: ?  Constipation due to opioid therapy ?  Leukocytosis ?  Hypotension ?  Prostate cancer metastatic to bone [facility]) ?  Cancer associated pain ?  Protein-calorie malnutrition, severe ?  Pressure injury of skin ? ? ?Abdominal pain; ?Severe opioid induced constipation. ?Patient reports Last full bowel movement approximately 10 days prior to admission. ?There was no improvement with senna, MiraLAX, Dulcolax suppository and enema. ?GI input is appreciated. ?MRI pelvis is negative for perirectal abscess. ?Fecal impaction is noted. ?Patient started on Movantik. ?Continue senna and MiraLAX. ?Continue enema as needed. ?Patient may need endoscopic disimpaction, however, will defer to the GI team. ?Continue supportive care. ?Patient had 2 bowel movements.  Movantik dose reduced. ?  ?Hypotension: ?Likely multifactorial. ?Patient has been on significant opiates, with poor decreased intake due to severe  constipation. ?Continue IV fluids as patient is likely volume depleted.   ?Hypovolemic shock ruled out.  Blood pressure is improving. ?  ?Leukocytosis > improving ?WBC up to 50 from 12.3 on 3/17.   ?Most likely secondary to Neulasta was given given 2 days prior to admission. ?WBC trended down from 48.9-15.2 ?No constitutional symptoms to suggest infection.   ?  ?  ?Cancer associated pain: ?Continue to hold hold further narcotics for now given severe constipation. ?Continue lidocaine patch for now. ?Continue Tylenol. ?  ?Prostate cancer metastatic to bone [facility]) ?Follows with [facility] oncology, Dr. Charisse March.   ?On active treatment with Doxetaxol, Zometa, Neulasta and started on Nubeqa on 3/17. ? ? ?DVT prophylaxis: Lovenox ?Code Status: Partial code ?Family Communication: No family at bedside ?Disposition Plan: Status is: Inpatient ?Remains inpatient appropriate because: Admitted for severe constipation secondary to opioid use.  Patient is getting Movintik and had a bowel movement today. ?  ? ?Consultants:  ?GI ? ?Procedures: None ?Antimicrobials none ? ?Subjective: ?Patient was seen and examined at bedside.  Overnight events noted.  Patient reports feeling better.  Patient had 2 bowel movements.  Reports abdominal pain is improved. ? ?Objective: ?Vitals:  ? 03/01/22 1613 03/01/22 1944 03/02/22 0021 03/02/22 0816  ?BP: (!) 99/59 1'16/74 99/62 98/61 '$  ?Pulse: 82 93 82 78  ?Resp: '17 16 17 15  '$ ?Temp: 98.3 ?F (36.8 ?C) 98.7 ?F (37.1 ?C) 98.4 ?F (36.9 ?C) 98.2 ?F (36.8 ?C)  ?TempSrc:  Oral  Oral  ?SpO2: 100% 100% 100% 100%  ?Weight:      ?Height:      ? ? ?Intake/Output Summary (Last 24 hours) at 03/02/2022 1510 ?Last data filed at 03/02/2022 0218 ?Gross per 24 hour  ?Intake  545.54 ml  ?Output 800 ml  ?Net -254.46 ml  ? ?Filed Weights  ? 02/27/22 1925 02/27/22 2113  ?Weight: 46.3 kg 45.1 kg  ? ? ?Examination: ? ?General exam: Appears comfortable, not in any acute distress. ?Respiratory system: CTA bilaterally, no wheezing,  no crackles, normal respiratory effort. ?Cardiovascular system: S1 & S2 heard, regular rate and rhythm, no murmur.   ?Gastrointestinal system: Abdomen is soft, mildly tender, nondistended, BS +. ?Central nervous system: Alert and oriented x 3. No focal neurological deficits. ?Extremities: No edema, no cyanosis, no clubbing. ?Skin: No rashes, lesions or ulcers ?Psychiatry: Judgement and insight appear normal. Mood & affect appropriate.  ? ? ? ?Data Reviewed: I have personally reviewed following labs and imaging studies ? ?CBC: ?Recent Labs  ?Lab 02/27/22 ?1507 02/28/22 ?0149 03/02/22 ?8657  ?WBC 50.5* 48.9* 15.2*  ?NEUTROABS  --  46.5* 14.7*  ?HGB 12.2* 11.0* 9.2*  ?HCT 37.2* 33.8* 28.0*  ?MCV 81.6 80.3 80.2  ?PLT 349 259 217  ? ?Basic Metabolic Panel: ?Recent Labs  ?Lab 02/27/22 ?1507 02/28/22 ?0149 03/01/22 ?8469 03/02/22 ?6295  ?NA 133* 133* 134* 133*  ?K 4.4 4.3 3.9 4.5  ?CL 98 103 102 104  ?CO2 '22 24 23 22  '$ ?GLUCOSE 154* 156* 62* 84  ?BUN '13 11 10 10  '$ ?CREATININE 0.76 0.67 0.50* 0.53*  ?CALCIUM 7.7* 7.3* 7.3* 7.1*  ?MG  --  2.0  --   --   ?PHOS  --   --  1.6* 2.2*  ? ?GFR: ?Estimated Creatinine Clearance: 53.2 mL/min (A) (by C-G formula based on SCr of 0.53 mg/dL (L)). ?Liver Function Tests: ?Recent Labs  ?Lab 02/27/22 ?1507 03/01/22 ?0353 03/02/22 ?2841  ?AST 29  --   --   ?ALT 11  --   --   ?ALKPHOS 126  --   --   ?BILITOT 0.9  --   --   ?PROT 5.5*  --   --   ?ALBUMIN 3.1* 2.1* 2.3*  ? ?Recent Labs  ?Lab 02/27/22 ?1507  ?LIPASE 23  ? ?No results for input(s): AMMONIA in the last 168 hours. ?Coagulation Profile: ?Recent Labs  ?Lab 02/27/22 ?1625  ?INR 1.0  ? ?Cardiac Enzymes: ?No results for input(s): CKTOTAL, CKMB, CKMBINDEX, TROPONINI in the last 168 hours. ?BNP (last 3 results) ?No results for input(s): PROBNP in the last 8760 hours. ?HbA1C: ?No results for input(s): HGBA1C in the last 72 hours. ?CBG: ?No results for input(s): GLUCAP in the last 168 hours. ?Lipid Profile: ?No results for input(s): CHOL, HDL,  LDLCALC, TRIG, CHOLHDL, LDLDIRECT in the last 72 hours. ?Thyroid Function Tests: ?No results for input(s): TSH, T4TOTAL, FREET4, T3FREE, THYROIDAB in the last 72 hours. ?Anemia Panel: ?No results for input(s): VITAMINB12, FOLATE, FERRITIN, TIBC, IRON, RETICCTPCT in the last 72 hours. ?Sepsis Labs: ?Recent Labs  ?Lab 02/27/22 ?1625 02/27/22 ?1818  ?LATICACIDVEN 4.9* 3.1*  ? ? ?Recent Results (from the past 240 hour(s))  ?Blood Culture (routine x 2)     Status: None (Preliminary result)  ? Collection Time: 02/27/22  2:41 PM  ? Specimen: BLOOD  ?Result Value Ref Range Status  ? Specimen Description BLOOD RIGHT ANTECUBITAL  Final  ? Special Requests   Final  ?  BOTTLES DRAWN AEROBIC AND ANAEROBIC Blood Culture adequate volume  ? Culture   Final  ?  NO GROWTH 3 DAYS ?Performed at [facility], [place] [address] [place], [place] [address] ?  ? Report Status PENDING  Incomplete  ?Blood Culture (routine  x 2)     Status: None (Preliminary result)  ? Collection Time: 02/27/22  4:26 PM  ? Specimen: Left Antecubital; Blood  ?Result Value Ref Range Status  ? Specimen Description LEFT ANTECUBITAL  Final  ? Special Requests   Final  ?  BOTTLES DRAWN AEROBIC AND ANAEROBIC Blood Culture adequate volume  ? Culture   Final  ?  NO GROWTH 3 DAYS ?Performed at [facility], [place] [address], [place], [place] [address] ?  ? Report Status PENDING  Incomplete  ?Resp Panel by RT-PCR (Flu A&B, Covid) Nasopharyngeal Swab     Status: None  ? Collection Time: 02/27/22  8:47 PM  ? Specimen: Nasopharyngeal Swab; Nasopharyngeal(NP) swabs in vial transport medium  ?Result Value Ref Range Status  ? SARS Coronavirus 2 by RT PCR NEGATIVE NEGATIVE Final  ?  Comment: (NOTE) ?SARS-CoV-2 target nucleic acids are NOT DETECTED. ? ?The SARS-CoV-2 RNA is generally detectable in upper respiratory ?specimens during the acute phase of infection. The lowest ?concentration of SARS-CoV-2 viral copies this assay can detect is ?138 copies/mL. A negative result  does not preclude SARS-Cov-2 ?infection and should not be used as the sole basis for treatment or ?other patient management decisions. A negative result may occur with  ?improper specimen collection/handlin

## 2022-03-03 ENCOUNTER — Inpatient Hospital Stay (HOSPITAL_COMMUNITY): Payer: Medicare HMO | Admitting: Certified Registered"

## 2022-03-03 ENCOUNTER — Encounter (HOSPITAL_COMMUNITY): Payer: Self-pay | Admitting: Internal Medicine

## 2022-03-03 ENCOUNTER — Encounter (HOSPITAL_COMMUNITY)
Admission: EM | Disposition: A | Discharge status: Home / Self Care | Payer: Self-pay | Source: Home / Self Care | Attending: Internal Medicine

## 2022-03-03 DIAGNOSIS — K6289 Other specified diseases of anus and rectum: Secondary | ICD-10-CM

## 2022-03-03 DIAGNOSIS — C61 Malignant neoplasm of prostate: Secondary | ICD-10-CM

## 2022-03-03 HISTORY — PX: FLEXIBLE SIGMOIDOSCOPY: SHX5431

## 2022-03-03 LAB — BASIC METABOLIC PANEL
Anion gap: 5 (ref 5–15)
BUN: 8 mg/dL (ref 8–23)
CO2: 21 mmol/L — ABNORMAL LOW (ref 22–32)
Calcium: 7.5 mg/dL — ABNORMAL LOW (ref 8.9–10.3)
Chloride: 109 mmol/L (ref 98–111)
Creatinine, Ser: 0.54 mg/dL — ABNORMAL LOW (ref 0.61–1.24)
GFR, Estimated: >60 mL/min (ref >60)
Glucose, Bld: 79 mg/dL (ref 70–99)
Potassium: 4.4 mmol/L (ref 3.5–5.1)
Sodium: 135 mmol/L (ref 135–145)

## 2022-03-03 LAB — CBC
HCT: 26.6 % — ABNORMAL LOW (ref 39.0–52.0)
Hemoglobin: 8.6 g/dL — ABNORMAL LOW (ref 13.0–17.0)
MCH: 25.9 pg — ABNORMAL LOW (ref 26.0–34.0)
MCHC: 32.3 g/dL (ref 30.0–36.0)
MCV: 80.1 fL (ref 80.0–100.0)
Platelets: 229 10*3/uL (ref 150–400)
RBC: 3.32 MIL/uL — ABNORMAL LOW (ref 4.22–5.81)
RDW: 21.2 % — ABNORMAL HIGH (ref 11.5–15.5)
WBC: 3.1 10*3/uL — ABNORMAL LOW (ref 4.0–10.5)
nRBC: 0 % (ref 0.0–0.2)

## 2022-03-03 LAB — MAGNESIUM: Magnesium: 2.1 mg/dL (ref 1.7–2.4)

## 2022-03-03 LAB — PHOSPHORUS: Phosphorus: 1.8 mg/dL — ABNORMAL LOW (ref 2.5–4.6)

## 2022-03-03 SURGERY — SIGMOIDOSCOPY, FLEXIBLE
Anesthesia: Monitor Anesthesia Care

## 2022-03-03 MED ORDER — PROPOFOL 500 MG/50ML IV EMUL
INTRAVENOUS | Status: DC | PRN
  Administered 2022-03-03: 100 ug/kg/min via INTRAVENOUS

## 2022-03-03 MED ORDER — SODIUM CHLORIDE 0.9 % IV SOLN: INTRAVENOUS | Status: DC

## 2022-03-03 MED ORDER — PROPOFOL 10 MG/ML IV BOLUS
INTRAVENOUS | Status: DC | PRN
  Administered 2022-03-03: 20 mg via INTRAVENOUS
  Administered 2022-03-03: 10 mg via INTRAVENOUS

## 2022-03-03 MED ORDER — LACTATED RINGERS IV SOLN: INTRAVENOUS | Status: DC

## 2022-03-03 MED ORDER — K PHOS MONO-SOD PHOS DI & MONO 155-852-130 MG PO TABS
250.0000 mg | ORAL_TABLET | Freq: Three times a day (TID) | ORAL | Status: DC
  Administered 2022-03-03 – 2022-03-04 (×4): 250 mg via ORAL
  Filled 2022-03-03 (×6): qty 1

## 2022-03-03 NOTE — Progress Notes (Signed)
?PROGRESS NOTE ? ? ? ?George Chavez  MIW:[id number] DOB: 1951-08-29 DOA: 02/27/2022 ?PCP: [facility]  ? ?Brief Narrative: ?Patient is a 71 year old African-American male with past medical history significant for metastatic prostate cancer with bone metastasis.  Patient also carries history of hyperlipidemia.  Patient has been on opiates for pain.  Patient presents with abdominal pain.  X-ray done revealed significant constipation/stool load. Patient has tried senna, MiraLAX, enema and suppository without significant result.  Patient seems to have rectal pain.  Consulted GI team, Dr. Collene Mares to assist with patient's management.  Patient may benefit from endoscopic disimpaction, however, will defer to Dr. Collene Mares.  GI has advised trying Movantik. Pharmacy has been consulted turning to assist with the dosing.  MRI of the pelvis negative for rectal abscess.  Fecal impaction is noted.  Patient has 2 bowel movements following having Movantik.  GI is planning to do flex sigmoidoscopy today. ? ?Assessment & Plan: ?  ?Principal Problem: ?  Abdominal pain ?Active Problems: ?  Constipation due to opioid therapy ?  Leukocytosis ?  Hypotension ?  Prostate cancer metastatic to bone [facility]) ?  Cancer associated pain ?  Protein-calorie malnutrition, severe ?  Pressure injury of skin ? ? ?Abdominal pain; ?Severe opioid induced constipation. ?Patient reports Last full bowel movement approximately 10 days prior to admission. ?There was no improvement with senna, MiraLAX, Dulcolax suppository and enema. ?GI input is appreciated. ?MRI pelvis is negative for perirectal abscess. ?Fecal impaction is noted. ?Patient started on Movantik. ?Continue senna and MiraLAX. ?Continue enema as needed. ?Patient may need endoscopic disimpaction, however, will defer to the GI team. ?Continue supportive care. ?Patient had 2 bowel movements.  Movantik dose reduced. ?GI is planning to do flex sigmoidoscopy today. ?  ?Hypotension: ?Likely  multifactorial. ?Patient has been on significantly high opiates, with poor decreased intake due to severe constipation. ?Continue IV fluids as patient is likely volume depleted.   ?Hypovolemic shock ruled out.  Blood pressure is improving. ?  ?Leukocytosis > improving ?WBC up to 50 from 12.3 on 3/17.   ?Most likely secondary to Neulasta, which was given given 2 days prior to admission. ?WBC trended down from 48.9-15.2>3.1 ?No constitutional symptoms to suggest infection.   ?  ?Cancer associated pain: ?Continue to hold hold further narcotics for now given severe constipation. ?Continue lidocaine patch for now. ?Continue Tylenol. ?  ?Prostate cancer metastatic to bone [facility]) ?Follows with [facility], Dr. Charisse March.   ?On active treatment with Doxetaxol, Zometa, Neulasta and started on Nubeqa on 3/17. ? ? ?DVT prophylaxis: Lovenox ?Code Status: Partial code ?Family Communication: No family at bedside ?Disposition Plan: Status is: Inpatient ?Remains inpatient appropriate because:  ?Admitted for severe constipation secondary to opioid use.  Patient is getting Movintik and had a bowel movement . ?GI is planning to do flex sigmoidoscopy today. ?  ?Consultants:  ?GI ? ?Procedures: None ?Antimicrobials none ? ?Subjective: ?Patient was seen and examined at bedside.  Overnight events noted.   ?Patient reports feeling much better.  Patient had 2 bowel movements yesterday, felt big relief.   ?He reports abdominal pain is improved. ? ?Objective: ?Vitals:  ? 03/02/22 2009 03/03/22 0534 03/03/22 0857 03/03/22 1319  ?BP: 110/64 109/71 109/86 (!) 108/54  ?Pulse: 72 79 85 74  ?Resp: '17 17 18 15  '$ ?Temp: 98.9 ?F (37.2 ?C) 98 ?F (36.7 ?C) 98.5 ?F (36.9 ?C) 98.2 ?F (36.8 ?C)  ?TempSrc: Oral Oral Oral Oral  ?SpO2: 100% 100% 100% 98%  ?Weight:  45.1 kg  ?Height:    '5\' 8"'$  (1.727 m)  ? ? ?Intake/Output Summary (Last 24 hours) at 03/03/2022 1407 ?Last data filed at 03/03/2022 1041 ?Gross per 24 hour  ?Intake 120 ml  ?Output 926 ml   ?Net -806 ml  ? ?Filed Weights  ? 02/27/22 1925 02/27/22 2113 03/03/22 1319  ?Weight: 46.3 kg 45.1 kg 45.1 kg  ? ? ?Examination: ? ?General exam: Appears comfortable, not in any acute distress. ?Respiratory system: CTA bilaterally, no wheezing, no crackles, Normal respiratory effort. ?Cardiovascular system: S1 & S2 heard, regular rate and rhythm, no murmur.   ?Gastrointestinal system: Abdomen is soft, mildly distended, mildly tender, BS+ ?Central nervous system: Alert and oriented x 3. No focal neurological deficits. ?Extremities: No edema, no cyanosis, no clubbing. ?Skin: No rashes, lesions or ulcers ?Psychiatry: Judgement and insight appear normal. Mood & affect appropriate.  ? ? ? ?Data Reviewed: I have personally reviewed following labs and imaging studies ? ?CBC: ?Recent Labs  ?Lab 02/27/22 ?1507 02/28/22 ?0149 03/02/22 ?4825 03/03/22 ?0142  ?WBC 50.5* 48.9* 15.2* 3.1*  ?NEUTROABS  --  46.5* 14.7*  --   ?HGB 12.2* 11.0* 9.2* 8.6*  ?HCT 37.2* 33.8* 28.0* 26.6*  ?MCV 81.6 80.3 80.2 80.1  ?PLT 349 259 217 229  ? ?Basic Metabolic Panel: ?Recent Labs  ?Lab 02/27/22 ?1507 02/28/22 ?0149 03/01/22 ?0037 03/02/22 ?0488 03/03/22 ?0142  ?NA 133* 133* 134* 133* 135  ?K 4.4 4.3 3.9 4.5 4.4  ?CL 98 103 102 104 109  ?CO2 '22 24 23 22 '$ 21*  ?GLUCOSE 154* 156* 62* 84 79  ?BUN '13 11 10 10 8  '$ ?CREATININE 0.76 0.67 0.50* 0.53* 0.54*  ?CALCIUM 7.7* 7.3* 7.3* 7.1* 7.5*  ?MG  --  2.0  --   --  2.1  ?PHOS  --   --  1.6* 2.2* 1.8*  ? ?GFR: ?Estimated Creatinine Clearance: 53.2 mL/min (A) (by C-G formula based on SCr of 0.54 mg/dL (L)). ?Liver Function Tests: ?Recent Labs  ?Lab 02/27/22 ?1507 03/01/22 ?0353 03/02/22 ?8916  ?AST 29  --   --   ?ALT 11  --   --   ?ALKPHOS 126  --   --   ?BILITOT 0.9  --   --   ?PROT 5.5*  --   --   ?ALBUMIN 3.1* 2.1* 2.3*  ? ?Recent Labs  ?Lab 02/27/22 ?1507  ?LIPASE 23  ? ?No results for input(s): AMMONIA in the last 168 hours. ?Coagulation Profile: ?Recent Labs  ?Lab 02/27/22 ?1625  ?INR 1.0  ? ?Cardiac  Enzymes: ?No results for input(s): CKTOTAL, CKMB, CKMBINDEX, TROPONINI in the last 168 hours. ?BNP (last 3 results) ?No results for input(s): PROBNP in the last 8760 hours. ?HbA1C: ?No results for input(s): HGBA1C in the last 72 hours. ?CBG: ?No results for input(s): GLUCAP in the last 168 hours. ?Lipid Profile: ?No results for input(s): CHOL, HDL, LDLCALC, TRIG, CHOLHDL, LDLDIRECT in the last 72 hours. ?Thyroid Function Tests: ?No results for input(s): TSH, T4TOTAL, FREET4, T3FREE, THYROIDAB in the last 72 hours. ?Anemia Panel: ?No results for input(s): VITAMINB12, FOLATE, FERRITIN, TIBC, IRON, RETICCTPCT in the last 72 hours. ?Sepsis Labs: ?Recent Labs  ?Lab 02/27/22 ?1625 02/27/22 ?1818  ?LATICACIDVEN 4.9* 3.1*  ? ? ?Recent Results (from the past 240 hour(s))  ?Blood Culture (routine x 2)     Status: None (Preliminary result)  ? Collection Time: 02/27/22  2:41 PM  ? Specimen: BLOOD  ?Result Value Ref Range Status  ? Specimen Description BLOOD RIGHT  ANTECUBITAL  Final  ? Special Requests   Final  ?  BOTTLES DRAWN AEROBIC AND ANAEROBIC Blood Culture adequate volume  ? Culture   Final  ?  NO GROWTH 4 DAYS ?Performed at [facility], [place] [address], [place], [place] [address] ?  ? Report Status PENDING  Incomplete  ?Blood Culture (routine x 2)     Status: None (Preliminary result)  ? Collection Time: 02/27/22  4:26 PM  ? Specimen: Left Antecubital; Blood  ?Result Value Ref Range Status  ? Specimen Description LEFT ANTECUBITAL  Final  ? Special Requests   Final  ?  BOTTLES DRAWN AEROBIC AND ANAEROBIC Blood Culture adequate volume  ? Culture   Final  ?  NO GROWTH 4 DAYS ?Performed at [facility], [place] [address], [place], [place] [address] ?  ? Report Status PENDING  Incomplete  ?Resp Panel by RT-PCR (Flu A&B, Covid) Nasopharyngeal Swab     Status: None  ? Collection Time: 02/27/22  8:47 PM  ? Specimen: Nasopharyngeal Swab; Nasopharyngeal(NP) swabs in vial transport medium  ?Result Value Ref Range Status  ?  SARS Coronavirus 2 by RT PCR NEGATIVE NEGATIVE Final  ?  Comment: (NOTE) ?SARS-CoV-2 target nucleic acids are NOT DETECTED. ? ?The SARS-CoV-2 RNA is generally detectable in upper respiratory ?specimens during the a

## 2022-03-03 NOTE — Transfer of Care (Signed)
Immediate Anesthesia Transfer of Care Note ? ?Patient: George Chavez ? ?Procedure(s) Performed: FLEXIBLE SIGMOIDOSCOPY ? ?Patient Location: PACU ? ?Anesthesia Type:MAC ? ?Level of Consciousness: drowsy and patient cooperative ? ?Airway & Oxygen Therapy: Patient Spontanous Breathing and Patient connected to nasal cannula oxygen ? ?Post-op Assessment: Report given to RN and Post -op Vital signs reviewed and stable ? ?Post vital signs: Reviewed and stable ? ?Last Vitals:  ?Vitals Value Taken Time  ?BP 103/60 03/03/22 1419  ?Temp    ?Pulse 81 03/03/22 1421  ?Resp 24 03/03/22 1421  ?SpO2 100 % 03/03/22 1421  ?Vitals shown include unvalidated device data. ? ?Last Pain:  ?Vitals:  ? 03/03/22 1319  ?TempSrc: Oral  ?PainSc: 0-No pain  ?   ? ?Patients Stated Pain Goal: 3 (02/28/22 0730) ? ?Complications: No notable events documented. ?

## 2022-03-03 NOTE — Anesthesia Preprocedure Evaluation (Signed)
Anesthesia Evaluation  ?Patient identified by MRN, date of birth, ID band ?Patient awake ? ? ? ?Reviewed: ?Allergy & Precautions, NPO status , Patient's Chart, lab work & pertinent test results ? ?Airway ?Mallampati: II ? ?TM Distance: >3 FB ?Neck ROM: Full ? ? ? Dental ?no notable dental hx. ? ?  ?Pulmonary ?neg pulmonary ROS, Current Smoker,  ?  ?Pulmonary exam normal ? ? ? ? ? ? ? Cardiovascular ?negative cardio ROS ? ? ?Rhythm:Regular Rate:Normal ? ? ?  ?Neuro/Psych ?negative neurological ROS ? negative psych ROS  ? GI/Hepatic ?Neg liver ROS, Rectal pain r/o ulceration vs fissure ?  ?Endo/Other  ?negative endocrine ROS ? Renal/GU ?negative Renal ROS  ? ?Prostate Ca ? ?  ?Musculoskeletal ?negative musculoskeletal ROS ?(+)  ? Abdominal ?Normal abdominal exam  (+)   ?Peds ? Hematology ?negative hematology ROS ?(+)   ?Anesthesia Other Findings ? ? Reproductive/Obstetrics ? ?  ? ? ? ? ? ? ? ? ? ? ? ? ? ?  ?  ? ? ? ? ? ? ? ? ?Anesthesia Physical ?Anesthesia Plan ? ?ASA: 2 ? ?Anesthesia Plan: MAC  ? ?Post-op Pain Management:   ? ?Induction: Intravenous ? ?PONV Risk Score and Plan: Propofol infusion and Treatment may vary due to age or medical condition ? ?Airway Management Planned: Simple Face Mask, Natural Airway and Nasal Cannula ? ?Additional Equipment: None ? ?Intra-op Plan:  ? ?Post-operative Plan:  ? ?Informed Consent: I have reviewed the patients History and Physical, chart, labs and discussed the procedure including the risks, benefits and alternatives for the proposed anesthesia with the patient or authorized representative who has indicated his/her understanding and acceptance.  ? ? ? ?Dental advisory given ? ?Plan Discussed with: CRNA ? ?Anesthesia Plan Comments:   ? ? ? ? ? ? ?Anesthesia Quick Evaluation ? ?

## 2022-03-03 NOTE — Progress Notes (Signed)
Mobility Specialist Progress Note: ? ? 03/03/22 1425  ?Mobility  ?Activity Off unit  ? ?Will follow-up as time allows.  ? ?George Chavez ?Mobility Specialist ?Primary Phone [phone] ? ?

## 2022-03-03 NOTE — Op Note (Signed)
[facility] ?Patient Name: George Chavez ?Procedure Date : 03/03/2022 ?MRN: [id number] ?Attending MD: Carol Ada , MD ?Date of Birth: Sep 26, 1951 ?CSN: [id number] ?Age: 71 ?Admit Type: Inpatient ?Procedure:                Flexible Sigmoidoscopy ?Indications:              Abnormal CT of the GI tract ?Providers:                Carol Ada, MD, Ervin Knack, RN, Primitivo Gauze  ?                          [place], Technician, [employer],  ?                          Technician ?Referring MD:              ?Medicines:                Propofol per Anesthesia ?Complications:            No immediate complications. ?Estimated Blood Loss:     Estimated blood loss: none. ?Procedure:                Pre-Anesthesia Assessment: ?                          - Prior to the procedure, a History and Physical  ?                          was performed, and patient medications and  ?                          allergies were reviewed. The patient's tolerance of  ?                          previous anesthesia was also reviewed. The risks  ?                          and benefits of the procedure and the sedation  ?                          options and risks were discussed with the patient.  ?                          All questions were answered, and informed consent  ?                          was obtained. Prior Anticoagulants: The patient has  ?                          taken no previous anticoagulant or antiplatelet  ?                          agents. ASA Grade Assessment: III - A patient with  ?  severe systemic disease. After reviewing the risks  ?                          and benefits, the patient was deemed in  ?                          satisfactory condition to undergo the procedure. ?                          - Sedation was administered by an anesthesia  ?                          professional. Deep sedation was attained. ?                          After obtaining informed consent, the  scope was  ?                          passed under direct vision. The [id number]-[id number] ([id number])  ?                          Olympus endoscope was introduced through the anus  ?                          and advanced to the the sigmoid colon. The flexible  ?                          sigmoidoscopy was accomplished without difficulty.  ?                          The patient tolerated the procedure well. The  ?                          quality of the bowel preparation was good. ?Scope In: ?Scope Out: ?Findings: ?     A segmental area of moderately erythematous mucosa was found in the  ?     rectum. At the dentate line there was evidence of some friability. The  ?     mucosa was abnormal and it appeared inflamed. This is presumed to be  ?     from the chronic constipation. There was no evidence of a stercoral  ?     ulcer or any other mucosal abnormalities in the rectum proximal to the  ?     dentate line region or in the sigmoid colon. ?Impression:               - Erythematous mucosa in the rectum. ?                          - No specimens collected. ?Recommendation:           - Continue with Movantik. ?                          - Lidocaine 5% PR, if necessary. ?                          -  Signing off. ?Procedure Code(s):        --- Professional --- ?                          [phone], Sigmoidoscopy, flexible; diagnostic,  ?                          including collection of specimen(s) by brushing or  ?                          washing, when performed (separate procedure) ?Diagnosis Code(s):        --- Professional --- ?                          K62.89, Other specified diseases of anus and rectum ?                          R93.3, Abnormal findings on diagnostic imaging of  ?                          other parts of digestive tract ?CPT copyright 2019 [employer]. All rights reserved. ?The codes documented in this report are preliminary and upon coder review may  ?be revised to meet current compliance  requirements. ?Carol Ada, MD ?Carol Ada, MD ?03/03/2022 2:20:18 PM ?This report has been signed electronically. ?Number of Addenda: 0 ?

## 2022-03-04 ENCOUNTER — Inpatient Hospital Stay (HOSPITAL_COMMUNITY): Payer: Medicare HMO

## 2022-03-04 LAB — CBC
HCT: 28.1 % — ABNORMAL LOW (ref 39.0–52.0)
Hemoglobin: 9.2 g/dL — ABNORMAL LOW (ref 13.0–17.0)
MCH: 26.4 pg (ref 26.0–34.0)
MCHC: 32.7 g/dL (ref 30.0–36.0)
MCV: 80.7 fL (ref 80.0–100.0)
Platelets: 240 10*3/uL (ref 150–400)
RBC: 3.48 MIL/uL — ABNORMAL LOW (ref 4.22–5.81)
RDW: 21.2 % — ABNORMAL HIGH (ref 11.5–15.5)
WBC: 1.5 10*3/uL — ABNORMAL LOW (ref 4.0–10.5)
nRBC: 1.4 % — ABNORMAL HIGH (ref 0.0–0.2)

## 2022-03-04 LAB — BASIC METABOLIC PANEL
Anion gap: 5 (ref 5–15)
BUN: 6 mg/dL — ABNORMAL LOW (ref 8–23)
CO2: 24 mmol/L (ref 22–32)
Calcium: 7.4 mg/dL — ABNORMAL LOW (ref 8.9–10.3)
Chloride: 109 mmol/L (ref 98–111)
Creatinine, Ser: 0.58 mg/dL — ABNORMAL LOW (ref 0.61–1.24)
GFR, Estimated: >60 mL/min (ref >60)
Glucose, Bld: 76 mg/dL (ref 70–99)
Potassium: 3.8 mmol/L (ref 3.5–5.1)
Sodium: 138 mmol/L (ref 135–145)

## 2022-03-04 LAB — CULTURE, BLOOD (ROUTINE X 2)
Culture: NO GROWTH
Culture: NO GROWTH
Special Requests: ADEQUATE
Special Requests: ADEQUATE

## 2022-03-04 LAB — PHOSPHORUS: Phosphorus: 1.7 mg/dL — ABNORMAL LOW (ref 2.5–4.6)

## 2022-03-04 LAB — MAGNESIUM: Magnesium: 1.9 mg/dL (ref 1.7–2.4)

## 2022-03-04 MED ORDER — K PHOS MONO-SOD PHOS DI & MONO 155-852-130 MG PO TABS
250.0000 mg | ORAL_TABLET | Freq: Every day | ORAL | 0 refills | Status: AC
Start: 2022-03-04 — End: 2022-04-03

## 2022-03-04 MED ORDER — ENSURE ENLIVE PO LIQD: 237.0000 mL | Freq: Three times a day (TID) | ORAL | 12 refills | Status: AC

## 2022-03-04 MED ORDER — SENNA 8.6 MG PO TABS
1.0000 | ORAL_TABLET | Freq: Two times a day (BID) | ORAL | 0 refills | Status: AC
Start: 2022-03-04 — End: 2022-04-03

## 2022-03-04 MED ORDER — BISACODYL 10 MG RE SUPP: 10.0000 mg | Freq: Every day | RECTAL | 0 refills | Status: AC | PRN

## 2022-03-04 MED ORDER — NALOXEGOL OXALATE 12.5 MG PO TABS: 12.5000 mg | ORAL_TABLET | Freq: Every day | ORAL | 0 refills | Status: AC

## 2022-03-04 MED ORDER — POLYETHYLENE GLYCOL 3350 17 G PO PACK
17.0000 g | PACK | Freq: Every day | ORAL | 0 refills | Status: AC | PRN
Start: 2022-03-04 — End: 2022-04-03

## 2022-03-04 NOTE — Care Management Important Message (Signed)
Important Message ? ?Patient Details  ?Name: George Chavez ?MRN: [id number] ?Date of Birth: 09/02/1951 ? ? ?Medicare Important Message Given:  Yes ? ? ? ? ?Levada Dy  Dmiyah Liscano-Martin ?03/04/2022, 2:34 PM ?

## 2022-03-04 NOTE — Plan of Care (Signed)
?  Problem: Education: ?Goal: Knowledge of General Education information will improve ?Description: Including pain rating scale, medication(s)/side effects and non-pharmacologic comfort measures ?Outcome: Adequate for Discharge ?  ?Problem: Health Behavior/Discharge Planning: ?Goal: Ability to manage health-related needs will improve ?Outcome: Adequate for Discharge ?  ?Problem: Clinical Measurements: ?Goal: Ability to maintain clinical measurements within normal limits will improve ?Outcome: Adequate for Discharge ?Goal: Will remain free from infection ?Outcome: Adequate for Discharge ?Goal: Diagnostic test results will improve ?Outcome: Adequate for Discharge ?Goal: Respiratory complications will improve ?Outcome: Adequate for Discharge ?Goal: Cardiovascular complication will be avoided ?Outcome: Adequate for Discharge ?  ?Problem: Activity: ?Goal: Risk for activity intolerance will decrease ?Outcome: Adequate for Discharge ?  ?Problem: Nutrition: ?Goal: Adequate nutrition will be maintained ?Outcome: Adequate for Discharge ?  ?Problem: Coping: ?Goal: Level of anxiety will decrease ?Outcome: Adequate for Discharge ?  ?Problem: Elimination: ?Goal: Will not experience complications related to bowel motility ?Outcome: Adequate for Discharge ?Goal: Will not experience complications related to urinary retention ?Outcome: Adequate for Discharge ?  ?Problem: Pain Managment: ?Goal: General experience of comfort will improve ?Outcome: Adequate for Discharge ?  ?Problem: Safety: ?Goal: Ability to remain free from injury will improve ?Outcome: Adequate for Discharge ?  ?Problem: Skin Integrity: ?Goal: Risk for impaired skin integrity will decrease ?Outcome: Adequate for Discharge ?  ?Problem: Malnutrition  (NI-5.2) ?Goal: Food and/or nutrient delivery ?Description: Individualized approach for food/nutrient provision. ?Outcome: Adequate for Discharge ?  ?Problem: Acute Rehab PT Goals(only PT should resolve) ?Goal: Patient Will  Transfer Sit To/From Stand ?Outcome: Adequate for Discharge ?Goal: Pt Will Transfer Bed To Chair/Chair To Bed ?Outcome: Adequate for Discharge ?Goal: Pt Will Ambulate ?Outcome: Adequate for Discharge ?Goal: Pt Will Go Up/Down Stairs ?Outcome: Adequate for Discharge ?  ?

## 2022-03-04 NOTE — Progress Notes (Signed)
Mobility Specialist Progress Note: ? ? 03/04/22 1050  ?Mobility  ?Activity Ambulated with assistance in room;Transferred from bed to chair  ?Level of Assistance Standby assist, set-up cues, supervision of patient - no hands on  ?Assistive Device Front wheel walker  ?Distance Ambulated (ft) 8 ft  ?Activity Response Tolerated well  ?$Mobility charge 1 Mobility  ? ?Pt received in bed willing to participate in mobility. Complaints of bottom feeling sore. Left in chair with call bell in reach and all needs met.  ? ?Jerrard Bradburn ?Mobility Specialist ?Primary Phone [phone] ? ?

## 2022-03-04 NOTE — Progress Notes (Signed)
Patient was given paper scrubs to wear home.  Patient is not appropriate for discharge lounge but his ride is on the way (within 30 min) ? ?Patient was given his discharge instructions and stated understanding. ?

## 2022-03-04 NOTE — Discharge Summary (Signed)
? ?Physician Discharge Summary  ?George Chavez PQZ:[id number] DOB: Jun 08, 1951 DOA: 02/27/2022 ? ?PCP: [facility] ? ?Admit date: 02/27/2022 ?Discharge date: 03/04/2022 ? ?Admitted From: Home ?Discharge disposition: Home ? ?Recommendations at discharge:  ?Maintain hydration ?Continue bowel regimen.  Avoid constipation ? ? ?Brief narrative: ?George Chavez is a 71 y.o. male with PMH significant for metastatic prostate cancer with bone metastasis, chronic opiate use for pain. ?Patient presented to the ED on 3/19 for abdominal pain ?X-ray showed significant amount of stool load. ?Due to severe constipation abdominal pain, admitted to hospital service. ? ? ?Subjective: ?Patient was seen and examined this morning.  Sitting up in chair.  Not in distress.  No new symptoms.  Wants to go home.  Son on the phone. ?Abdominal x-ray this morning shows significant improvement in the stool burden. ? ?Principal Problem: ?  Abdominal pain ?Active Problems: ?  Constipation due to opioid therapy ?  Leukocytosis ?  Hypotension ?  Prostate cancer metastatic to bone [facility]) ?  Cancer associated pain ?  Protein-calorie malnutrition, severe ?  Pressure injury of skin ?  ?Assessment and Plan: ?Severe abdominal pain ?Severe opioid associated constipation ?-Presented with severe abdominal pain, last full bowel movement approximately 10 days prior to admission.  ?-X-ray with significant amount of stool load ?-He was ried on multiple stool softeners and laxatives including Senokot, MiraLAX, glycerin suppository, Dulcolax suppository, enema but no success ?-MRI pelvis on 3/21 showed rectum distended with stool, no masses. ?-GI was consulted.  Movantik was added with no success ?-3/23, GI did a flex sigmoidoscopy.  Per report, patient had area of inflammation, friability in the rectal mucosa without any ulcer.   ?-Recommended to continue Movantik and 5% lidocaine. ?-Repeat x-ray today shows significant improvement in stool  burden. ?-Discharge home on Movantik. ? ?Leukocytosis ?-WBC count on admission was significantly up at 50,000.  Most likely secondary to Neulasta OBI given 2 days prior to admit.  He had no evidence of infection.  Significant drop in WBC count since then. ?Recent Labs  ?Lab 02/27/22 ?1507 02/27/22 ?1625 02/27/22 ?3354 02/28/22 ?0149 03/02/22 ?5625 03/03/22 ?0142 03/04/22 ?0138  ?WBC 50.5*  --   --  48.9* 15.2* 3.1* 1.5*  ?LATICACIDVEN  --  4.9* 3.1*  --   --   --   --   ? ?Hypotension/lactic acidosis ?-No evidence of infection.  Lactic acid level was up probably because of hypotension. ?-Currently on IV fluid.  Improving lactic acid level ? ?Hypophosphatemia ?-Low phosphate level on regular basis.  Potassium phosphate supplementation started. ?Recent Labs  ?Lab 02/28/22 ?0149 03/01/22 ?6389 03/02/22 ?3734 03/03/22 ?0142 03/04/22 ?0138  ?K 4.3 3.9 4.5 4.4 3.8  ?MG 2.0  --   --  2.1 1.9  ?PHOS  --  1.6* 2.2* 1.8* 1.7*  ? ? ? ?Cancer associated pain ?-At home, patient was on oxycodone 5 mg twice daily, Neurontin 200 mg twice daily.   ?-Currently oxycodone is on hold because of severe constipation.  Can continue as needed at home.  Continue lidocaine patch, Tylenol. ? ?Prostate cancer metastatic to bone  ?-Follows with [facility], Dr. Charisse Chavez.  ?-On active treatment with Doxetaxol, Zometa, Neulasta and started on Nubeqa on 3/17. ? ?Wounds:  ?- ?Pressure Injury 02/28/22 Sacrum Medial Stage 2 -  Partial thickness loss of dermis presenting as a shallow open injury with a red, pink wound bed without slough. (Active)  ?Date First Assessed/Time First Assessed: 02/28/22 0730   Location: Sacrum  Location Orientation: Medial  Staging: Stage 2 -  Partial thickness loss of dermis presenting as a shallow open injury with a red, pink wound bed without slough.  Present on Ad...  ?  ?Assessments 02/28/2022  7:30 AM 03/03/2022  9:20 PM  ?Dressing Type Foam - Lift dressing to assess site every shift Foam - Lift dressing to  assess site every shift  ?Dressing -- Clean, Dry, Intact  ?% Wound base Red or Granulating 100% --  ?Wound Length (cm) 0.5 cm --  ?Wound Width (cm) 0.5 cm --  ?Wound Depth (cm) 0 cm --  ?Wound Surface Area (cm^2) 0.25 cm^2 --  ?Wound Volume (cm^3) 0 cm^3 --  ?Drainage Amount None None  ?   ?No Linked orders to display  ? ? ?Discharge Exam:  ? ?Vitals:  ? 03/04/22 0109 03/04/22 0533 03/04/22 0747 03/04/22 1113  ?BP: (!) 188/127 132/68 113/70 127/72  ?Pulse: 77 74 87 84  ?Resp: '17 18 18 18  '$ ?Temp: 98.1 ?F (36.7 ?C) 98.4 ?F (36.9 ?C) 98.2 ?F (36.8 ?C) 97.9 ?F (36.6 ?C)  ?TempSrc: Oral Oral    ?SpO2: 100% 100% 100% 99%  ?Weight:      ?Height:      ? ? ?Body mass index is 15.12 kg/m?.  ?General exam: Pleasant, elderly African-American male.  Not in physical distress ?Skin: No rashes, lesions or ulcers. ?HEENT: Atraumatic, normocephalic, no obvious bleeding ?Lungs: Clear to auscultation bilaterally ?CVS: Regular rate and rhythm, no murmur ?GI/Abd soft, nontender, nondistended, bowels are present ?CNS: Alert, awake, oriented x3 ?Psychiatry: Mood appropriate ?Extremities: No pedal edema, no calf tenderness ? ?Follow ups:  ? ? Follow-up Information   ? ? [place]. Schedule an appointment as soon as possible for a visit .   ?Contact information: ?[place] [address] ?[phone] ? ? ?  ?  ? ? [place]. Go to .   ?Specialty: Emergency Medicine ?Why: If symptoms worsen ?Contact information: ?[address] ?[id number] mc ?[place] ?[phone] ? ?  ?  ? ? [facility] [place] Follow up.   ?Specialty: Home Health Services ?Contact information: ?[address] ?[address] ?[place] [address] ?[phone] ? ? ?  ?  ? ?  ?  ? ?  ? ? ?Discharge Instructions:  ? ?Discharge Instructions   ? ? Call MD for:  difficulty breathing, headache or visual disturbances   Complete by: As directed ?  ? Call MD for:  extreme fatigue    Complete by: As directed ?  ? Call MD for:  hives   Complete by: As directed ?  ? Call MD for:  persistant dizziness or light-headedness   Complete by: As directed ?  ? Call MD for:  persistant nausea and vomiting   Complete by: As directed ?  ? Call MD for:  severe uncontrolled pain   Complete by: As directed ?  ? Call MD for:  temperature >100.4   Complete by: As directed ?  ? Diet general   Complete by: As directed ?  ? Discharge instructions   Complete by: As directed ?  ? Recommendations at discharge:  ?? Maintain hydration ?? Continue bowel regimen.  Avoid constipation ? ?General discharge instructions: ?Follow with Primary MD [place] in 7 days  ?Please request your PCP  to go over your hospital tests, procedures, radiology results at the follow up. Please get your medicines reviewed and  adjusted.  Your PCP may decide to repeat certain labs or tests as needed. ?Do not drive, operate heavy machinery, perform activities at heights, swimming or participation in water activities or provide baby sitting services if your were admitted for syncope or siezures until you have seen by Primary MD or a Neurologist and advised to do so again. ?[place] Controlled Substance Reporting System database was reviewed. Do not drive, operate heavy machinery, perform activities at heights, swim, participate in water activities or provide baby-sitting services while on medications for pain, sleep and mood until your outpatient physician has reevaluated you and advised to do so again.  You are strongly recommended to comply with the dose, frequency and duration of prescribed medications. ?Activity: As tolerated with Full fall precautions use walker/cane & assistance as needed ?Avoid using any recreational substances like cigarette, tobacco, alcohol, or non-prescribed drug. ?If you experience worsening of your admission symptoms, develop shortness of breath, life threatening emergency, suicidal or homicidal  thoughts you must seek medical attention immediately by calling 911 or calling your MD immediately  if symptoms less severe. ?You must read complete instructions/literature along with all the possible adverse reactions/side

## 2022-03-04 NOTE — Evaluation (Signed)
Physical Therapy Evaluation ?Patient Details ?Name: George Chavez ?MRN: [id number] ?DOB: June 30, 1951 ?Today's Date: 03/04/2022 ? ?History of Present Illness ? Pt is 71 yo male admitted on 02/27/22 with abdominal pain with significant stool load.  Attempted medical management but required GI consult and GI flex sigmoidoscopy on 03/03/22.  Pt with hx including prostate CA with bone metastasis with chronic opiate use for pain  ?Clinical Impression ? Pt admitted with above diagnosis. At baseline, pt resides with family and reports near 24 hr care.  Reports limited ambulation at baseline and easily fatigues.  Today, PT saw pt after had already ambulated, sat in chair, and reports fatigued and wanting to lay back down.  Only able to ambulate a few feet with min guard but no physical assist.  Pt feels that he has adequate support at home and when family not there he just uses BSC and stays in bed/chair. Pt was getting HHPT and would continue to benefit.  Could also benefit from transport or w/c for longer distance mobility if desired by family.  Pt currently with functional limitations due to the deficits listed below (see PT Problem List). Pt will benefit from skilled PT to increase their independence and safety with mobility to allow discharge to the venue listed below.   ?   ?   ? ?Recommendations for follow up therapy are one component of a multi-disciplinary discharge planning process, led by the attending physician.  Recommendations may be updated based on patient status, additional functional criteria and insurance authorization. ? ?Follow Up Recommendations Home health PT ? ?  ?Assistance Recommended at Discharge Frequent or constant Supervision/Assistance  ?Patient can return home with the following ? A little help with walking and/or transfers;A little help with bathing/dressing/bathroom;Help with stairs or ramp for entrance ? ?  ?Equipment Recommendations Wheelchair (measurements PT);Wheelchair cushion (measurements  PT) (w/c vs transport chair if pt/family want for longer distances)  ?Recommendations for Other Services ?    ?  ?Functional Status Assessment Patient has had a recent decline in their functional status and demonstrates the ability to make significant improvements in function in a reasonable and predictable amount of time.  ? ?  ?Precautions / Restrictions Precautions ?Precautions: Fall  ? ?  ? ?Mobility ? Bed Mobility ?Overal bed mobility: Needs Assistance ?Bed Mobility: Supine to Sit, Sit to Supine ?  ?  ?Supine to sit: Supervision ?Sit to supine: Supervision ?  ?  ?  ? ?Transfers ?Overall transfer level: Needs assistance ?Equipment used: Rolling walker (2 wheels) ?Transfers: Sit to/from Stand ?Sit to Stand: Min guard ?  ?  ?  ?  ?  ?  ?  ? ?Ambulation/Gait ?Ambulation/Gait assistance: Min guard ?Gait Distance (Feet): 8 Feet ?Assistive device: Rolling walker (2 wheels) ?Gait Pattern/deviations: Decreased stride length, Step-to pattern ?Gait velocity: decreased ?  ?  ?General Gait Details: min guard for safety; fatigued easily - pt reports fatigued at arrival (had ambulated with mobility specialist, sat in chair, and getting ready to go back to bed) ? ?Stairs ?  ?  ?  ?  ?  ? ?Wheelchair Mobility ?  ? ?Modified Rankin (Stroke Patients Only) ?  ? ?  ? ?Balance Overall balance assessment: Needs assistance ?Sitting-balance support: No upper extremity supported ?Sitting balance-Leahy Scale: Good ?  ?  ?Standing balance support: Bilateral upper extremity supported, Reliant on assistive device for balance ?Standing balance-Leahy Scale: Poor ?Standing balance comment: RW and min guard ?  ?  ?  ?  ?  ?  ?  ?  ?  ?  ?  ?   ? ? ? ?  Pertinent Vitals/Pain Pain Assessment ?Pain Assessment: No/denies pain  ? ? ?Home Living Family/patient expects to be discharged to:: Private residence ?Living Arrangements: Children (son and daughter in law) ?Available Help at Discharge: Family;Available PRN/intermittently (reports dtr in law gone  maybe 4 hr/day) ?Type of Home: House ?Home Access: Stairs to enter ?Entrance Stairs-Rails: Right;Left ?Entrance Stairs-Number of Steps: 4 ?  ?Home Layout: One level ?Home Equipment: Rolling Walker (2 wheels);BSC/3in1;Shower seat ?Additional Comments: mobility specialist had spoke with daughter in law earlier and reports they were getting HHPT  ?  ?Prior Function   ?  ?  ?  ?  ?  ?  ?Mobility Comments: Reports easily fatigued and only walks short distances in house; is able to get out to car with assist from family and neighbors; uses RW; 2 falls in last 6 months ?ADLs Comments: Reports independent but modified and fatigues easily; only does sponge baths; uses BSC if family not there ?  ? ? ?Hand Dominance  ?   ? ?  ?Extremity/Trunk Assessment  ? Upper Extremity Assessment ?Upper Extremity Assessment: LUE deficits/detail;RUE deficits/detail ?RUE Deficits / Details: ROM WFL - some limitations at shoulders but functional; MMT 4-/5 ?LUE Deficits / Details: ROM WFL - some limitations at shoulders but functional; MMT 4-/5 ?  ? ?Lower Extremity Assessment ?Lower Extremity Assessment: LLE deficits/detail;RLE deficits/detail ?RLE Deficits / Details: ROM WFL; MMT 4-/5 ?LLE Deficits / Details: ROM WFL; MMT 4-/5 ?  ? ?Cervical / Trunk Assessment ?Cervical / Trunk Assessment: Kyphotic (mild kyphosis and forward head)  ?Communication  ? Communication: No difficulties  ?Cognition Arousal/Alertness: Awake/alert ?Behavior During Therapy: [facility] for tasks assessed/performed ?Overall Cognitive Status: Within Functional Limits for tasks assessed ?  ?  ?  ?  ?  ?  ?  ?  ?  ?  ?  ?  ?  ?  ?  ?  ?  ?  ?  ? ?  ?General Comments   ? ?  ?Exercises    ? ?Assessment/Plan  ?  ?PT Assessment Patient needs continued PT services  ?PT Problem List Decreased strength;Decreased range of motion;Decreased activity tolerance;Decreased balance;Decreased mobility;Decreased knowledge of use of DME ? ?   ?  ?PT Treatment Interventions DME  instruction;Therapeutic activities;Gait training;Therapeutic exercise;Patient/family education;Stair training;Balance training;Functional mobility training   ? ?PT Goals (Current goals can be found in the Care Plan section)  ?Acute Rehab PT Goals ?Patient Stated Goal: return home ?PT Goal Formulation: With patient ?Time For Goal Achievement: 03/18/22 ?Potential to Achieve Goals: Fair ? ?  ?Frequency Min 3X/week ?  ? ? ?Co-evaluation   ?  ?  ?  ?  ? ? ?  ?AM-PAC PT "6 Clicks" Mobility  ?Outcome Measure Help needed turning from your back to your side while in a flat bed without using bedrails?: None ?Help needed moving from lying on your back to sitting on the side of a flat bed without using bedrails?: None ?Help needed moving to and from a bed to a chair (including a wheelchair)?: A Little ?Help needed standing up from a chair using your arms (e.g., wheelchair or bedside chair)?: A Little ?Help needed to walk in hospital room?: Total (<20') ?Help needed climbing 3-5 steps with a railing? : A Lot ?6 Click Score: 17 ? ?  ?End of Session Equipment Utilized During Treatment: Gait belt ?Activity Tolerance: Patient limited by fatigue ?Patient left: in bed;with call bell/phone within reach;with bed alarm set ?Nurse Communication: Mobility status ?PT Visit Diagnosis: Other abnormalities of  gait and mobility (R26.89);Muscle weakness (generalized) (M62.81) ?  ? ?Time: 5465-6812 ?PT Time Calculation (min) (ACUTE ONLY): 17 min ? ? ?Charges:   PT Evaluation ?$PT Eval Low Complexity: 1 Low ?  ?  ?   ? ? ?Abran Richard, PT ?Acute Rehab Services ?Pager [phone] ?[facility] [phone] ? ? ?Mikael Spray Akeema Broder ?03/04/2022, 12:56 PM ? ?

## 2022-03-06 ENCOUNTER — Encounter (HOSPITAL_COMMUNITY): Payer: Self-pay | Admitting: Gastroenterology

## 2022-03-07 ENCOUNTER — Encounter (HOSPITAL_COMMUNITY): Payer: Self-pay | Admitting: Emergency Medicine

## 2022-03-07 ENCOUNTER — Emergency Department (HOSPITAL_COMMUNITY): Payer: Medicare HMO

## 2022-03-07 ENCOUNTER — Other Ambulatory Visit: Payer: Self-pay

## 2022-03-07 ENCOUNTER — Inpatient Hospital Stay (HOSPITAL_COMMUNITY)
Admission: EM | Admit: 2022-03-07 | Discharge: 2022-03-11 | DRG: 867 | Disposition: A | Discharge status: Home Health | Payer: Medicare HMO | Attending: Family Medicine | Admitting: Family Medicine

## 2022-03-07 DIAGNOSIS — E1151 Type 2 diabetes mellitus with diabetic peripheral angiopathy without gangrene: Secondary | ICD-10-CM | POA: Diagnosis present

## 2022-03-07 DIAGNOSIS — Z681 Body mass index (BMI) 19 or less, adult: Secondary | ICD-10-CM | POA: Diagnosis not present

## 2022-03-07 DIAGNOSIS — T451X5A Adverse effect of antineoplastic and immunosuppressive drugs, initial encounter: Secondary | ICD-10-CM | POA: Diagnosis present

## 2022-03-07 DIAGNOSIS — Z79899 Other long term (current) drug therapy: Secondary | ICD-10-CM | POA: Diagnosis not present

## 2022-03-07 DIAGNOSIS — D72829 Elevated white blood cell count, unspecified: Secondary | ICD-10-CM

## 2022-03-07 DIAGNOSIS — K5903 Drug induced constipation: Secondary | ICD-10-CM | POA: Diagnosis present

## 2022-03-07 DIAGNOSIS — Z9221 Personal history of antineoplastic chemotherapy: Secondary | ICD-10-CM | POA: Diagnosis not present

## 2022-03-07 DIAGNOSIS — I82621 Acute embolism and thrombosis of deep veins of right upper extremity: Secondary | ICD-10-CM | POA: Diagnosis present

## 2022-03-07 DIAGNOSIS — D84821 Immunodeficiency due to drugs: Secondary | ICD-10-CM | POA: Diagnosis present

## 2022-03-07 DIAGNOSIS — E872 Acidosis, unspecified: Secondary | ICD-10-CM | POA: Diagnosis present

## 2022-03-07 DIAGNOSIS — E119 Type 2 diabetes mellitus without complications: Secondary | ICD-10-CM

## 2022-03-07 DIAGNOSIS — Z8546 Personal history of malignant neoplasm of prostate: Secondary | ICD-10-CM | POA: Diagnosis not present

## 2022-03-07 DIAGNOSIS — L89152 Pressure ulcer of sacral region, stage 2: Secondary | ICD-10-CM | POA: Diagnosis present

## 2022-03-07 DIAGNOSIS — E43 Unspecified severe protein-calorie malnutrition: Secondary | ICD-10-CM | POA: Diagnosis present

## 2022-03-07 DIAGNOSIS — L03113 Cellulitis of right upper limb: Secondary | ICD-10-CM | POA: Diagnosis present

## 2022-03-07 DIAGNOSIS — Z66 Do not resuscitate: Secondary | ICD-10-CM | POA: Diagnosis present

## 2022-03-07 DIAGNOSIS — R609 Edema, unspecified: Secondary | ICD-10-CM | POA: Diagnosis not present

## 2022-03-07 DIAGNOSIS — C7951 Secondary malignant neoplasm of bone: Secondary | ICD-10-CM | POA: Diagnosis present

## 2022-03-07 DIAGNOSIS — F17210 Nicotine dependence, cigarettes, uncomplicated: Secondary | ICD-10-CM | POA: Diagnosis present

## 2022-03-07 DIAGNOSIS — A419 Sepsis, unspecified organism: Secondary | ICD-10-CM | POA: Diagnosis present

## 2022-03-07 DIAGNOSIS — T402X5A Adverse effect of other opioids, initial encounter: Secondary | ICD-10-CM | POA: Diagnosis present

## 2022-03-07 DIAGNOSIS — T8029XA Infection following other infusion, transfusion and therapeutic injection, initial encounter: Principal | ICD-10-CM | POA: Diagnosis present

## 2022-03-07 DIAGNOSIS — R6520 Severe sepsis without septic shock: Secondary | ICD-10-CM | POA: Diagnosis present

## 2022-03-07 HISTORY — DX: Type 2 diabetes mellitus without complications: E11.9

## 2022-03-07 HISTORY — DX: Unspecified protein-calorie malnutrition: E46

## 2022-03-07 LAB — COMPREHENSIVE METABOLIC PANEL
ALT: 12 U/L (ref 0–44)
AST: 24 U/L (ref 15–41)
Albumin: 2.9 g/dL — ABNORMAL LOW (ref 3.5–5.0)
Alkaline Phosphatase: 197 U/L — ABNORMAL HIGH (ref 38–126)
Anion gap: 12 (ref 5–15)
BUN: 10 mg/dL (ref 8–23)
CO2: 22 mmol/L (ref 22–32)
Calcium: 7.8 mg/dL — ABNORMAL LOW (ref 8.9–10.3)
Chloride: 104 mmol/L (ref 98–111)
Creatinine, Ser: 0.7 mg/dL (ref 0.61–1.24)
GFR, Estimated: >60 mL/min (ref >60)
Glucose, Bld: 72 mg/dL (ref 70–99)
Potassium: 3.6 mmol/L (ref 3.5–5.1)
Sodium: 138 mmol/L (ref 135–145)
Total Bilirubin: 0.7 mg/dL (ref 0.3–1.2)
Total Protein: 5.6 g/dL — ABNORMAL LOW (ref 6.5–8.1)

## 2022-03-07 LAB — CBC WITH DIFFERENTIAL/PLATELET
Abs Immature Granulocytes: 4.42 10*3/uL — ABNORMAL HIGH (ref 0.00–0.07)
Basophils Absolute: 0 10*3/uL (ref 0.0–0.1)
Basophils Relative: 0 %
Eosinophils Absolute: 0 10*3/uL (ref 0.0–0.5)
Eosinophils Relative: 0 %
HCT: 37.6 % — ABNORMAL LOW (ref 39.0–52.0)
Hemoglobin: 12 g/dL — ABNORMAL LOW (ref 13.0–17.0)
Immature Granulocytes: 10 %
Lymphocytes Relative: 3 %
Lymphs Abs: 1.2 10*3/uL (ref 0.7–4.0)
MCH: 26.3 pg (ref 26.0–34.0)
MCHC: 31.9 g/dL (ref 30.0–36.0)
MCV: 82.3 fL (ref 80.0–100.0)
Monocytes Absolute: 1.2 10*3/uL — ABNORMAL HIGH (ref 0.1–1.0)
Monocytes Relative: 3 %
Neutro Abs: 37.4 10*3/uL — ABNORMAL HIGH (ref 1.7–7.7)
Neutrophils Relative %: 84 %
Platelets: 444 10*3/uL — ABNORMAL HIGH (ref 150–400)
RBC: 4.57 MIL/uL (ref 4.22–5.81)
RDW: 23.2 % — ABNORMAL HIGH (ref 11.5–15.5)
WBC: 44.2 10*3/uL — ABNORMAL HIGH (ref 4.0–10.5)
nRBC: 0.5 % — ABNORMAL HIGH (ref 0.0–0.2)

## 2022-03-07 LAB — SEDIMENTATION RATE: Sed Rate: 5 mm/hr (ref 0–16)

## 2022-03-07 LAB — LACTIC ACID, PLASMA
Lactic Acid, Venous: 2.4 mmol/L (ref 0.5–1.9)
Lactic Acid, Venous: 2.3 mmol/L (ref 0.5–1.9)

## 2022-03-07 MED ORDER — LACTATED RINGERS IV BOLUS (SEPSIS)
500.0000 mL | Freq: Once | INTRAVENOUS | Status: AC
  Administered 2022-03-08: 500 mL via INTRAVENOUS

## 2022-03-07 MED ORDER — SENNA 8.6 MG PO TABS
1.0000 | ORAL_TABLET | Freq: Two times a day (BID) | ORAL | Status: DC
  Administered 2022-03-08 – 2022-03-11 (×8): 8.6 mg via ORAL
  Filled 2022-03-07 (×8): qty 1

## 2022-03-07 MED ORDER — NALOXONE HCL 0.4 MG/ML IJ SOLN: 0.4000 mg | INTRAMUSCULAR | Status: DC | PRN

## 2022-03-07 MED ORDER — K PHOS MONO-SOD PHOS DI & MONO 155-852-130 MG PO TABS
250.0000 mg | ORAL_TABLET | Freq: Every day | ORAL | Status: DC
  Administered 2022-03-08 – 2022-03-11 (×4): 250 mg via ORAL
  Filled 2022-03-07 (×4): qty 1

## 2022-03-07 MED ORDER — ENSURE ENLIVE PO LIQD
237.0000 mL | Freq: Three times a day (TID) | ORAL | Status: DC
  Administered 2022-03-08 – 2022-03-11 (×8): 237 mL via ORAL
  Filled 2022-03-07: qty 237

## 2022-03-07 MED ORDER — SODIUM CHLORIDE 0.9 % IV SOLN: 2.0000 g | Freq: Once | INTRAVENOUS | Status: DC

## 2022-03-07 MED ORDER — LACTATED RINGERS IV SOLN: INTRAVENOUS | Status: DC

## 2022-03-07 MED ORDER — SODIUM CHLORIDE 0.9 % IV SOLN
2.0000 g | Freq: Two times a day (BID) | INTRAVENOUS | Status: DC
  Administered 2022-03-07 – 2022-03-10 (×7): 2 g via INTRAVENOUS
  Filled 2022-03-07 (×7): qty 2

## 2022-03-07 MED ORDER — INSULIN ASPART 100 UNIT/ML IJ SOLN
0.0000 [IU] | Freq: Three times a day (TID) | INTRAMUSCULAR | Status: DC
  Administered 2022-03-08 – 2022-03-10 (×2): 1 [IU] via SUBCUTANEOUS

## 2022-03-07 MED ORDER — MORPHINE SULFATE (PF) 4 MG/ML IV SOLN
4.0000 mg | Freq: Once | INTRAVENOUS | Status: AC
  Administered 2022-03-07: 4 mg via INTRAVENOUS
  Filled 2022-03-07: qty 1

## 2022-03-07 MED ORDER — ACETAMINOPHEN 650 MG RE SUPP: 650.0000 mg | Freq: Four times a day (QID) | RECTAL | Status: DC | PRN

## 2022-03-07 MED ORDER — ONDANSETRON HCL 4 MG/2ML IJ SOLN
4.0000 mg | Freq: Four times a day (QID) | INTRAMUSCULAR | Status: DC | PRN
Start: 2022-03-07 — End: 2022-03-11

## 2022-03-07 MED ORDER — GABAPENTIN 100 MG PO CAPS
200.0000 mg | ORAL_CAPSULE | Freq: Two times a day (BID) | ORAL | Status: DC
Start: 2022-03-08 — End: 2022-03-11
  Administered 2022-03-08 – 2022-03-11 (×7): 200 mg via ORAL
  Filled 2022-03-07 (×7): qty 2

## 2022-03-07 MED ORDER — VANCOMYCIN HCL 750 MG/150ML IV SOLN
750.0000 mg | Freq: Once | INTRAVENOUS | Status: AC
  Administered 2022-03-08: 750 mg via INTRAVENOUS
  Filled 2022-03-07: qty 150

## 2022-03-07 MED ORDER — FENTANYL CITRATE PF 50 MCG/ML IJ SOSY: 25.0000 ug | PREFILLED_SYRINGE | INTRAMUSCULAR | Status: DC | PRN

## 2022-03-07 MED ORDER — NALOXEGOL OXALATE 12.5 MG PO TABS
12.5000 mg | ORAL_TABLET | Freq: Every day | ORAL | Status: DC
  Administered 2022-03-08 – 2022-03-11 (×4): 12.5 mg via ORAL
  Filled 2022-03-07 (×4): qty 1

## 2022-03-07 MED ORDER — VANCOMYCIN HCL IN DEXTROSE 1-5 GM/200ML-% IV SOLN: 1000.0000 mg | Freq: Once | INTRAVENOUS | Status: DC

## 2022-03-07 MED ORDER — VANCOMYCIN HCL 750 MG/150ML IV SOLN
750.0000 mg | Freq: Two times a day (BID) | INTRAVENOUS | Status: DC
  Administered 2022-03-08: 750 mg via INTRAVENOUS
  Filled 2022-03-07: qty 150

## 2022-03-07 MED ORDER — POLYETHYLENE GLYCOL 3350 17 G PO PACK: 17.0000 g | PACK | Freq: Every day | ORAL | Status: DC | PRN

## 2022-03-07 MED ORDER — ACETAMINOPHEN 325 MG PO TABS: 650.0000 mg | ORAL_TABLET | Freq: Four times a day (QID) | ORAL | Status: DC | PRN

## 2022-03-07 MED ORDER — LACTATED RINGERS IV BOLUS (SEPSIS)
1000.0000 mL | Freq: Once | INTRAVENOUS | Status: AC
  Administered 2022-03-07: 1000 mL via INTRAVENOUS

## 2022-03-07 NOTE — ED Provider Triage Note (Signed)
Emergency Medicine Provider Triage Evaluation Note ? ?George Chavez , a 71 y.o. male  was evaluated in triage.  Pt complains of swelling and erythema to right arm.  Patient's family over at bedside reports that patient had his last round of chemotherapy on 3/17 and infusion occurred to right [facility].  Patient was admitted to the hospital on 3/19 and IV was also inserted into the right [facility].  Patient has had progressively worsening swelling and erythema.  Erythema and swelling have gotten progressively worse since Saturday. ? ?Review of Systems  ?Positive: Swelling, erythema, ?Negative: Fever, chills, numbness, weakness, purulent discharge ? ?Physical Exam  ?BP 94/72   Pulse 92   Temp 98.1 ?F (36.7 ?C) (Oral)   Resp 12   SpO2 98%  ?Gen:   Awake, no distress   ?Resp:  Normal effort  ?MSK:   Moves extremities without difficulty  ?Other:  +2 radial pulse bilaterally.  Patient has swelling to right forearm.  Small abrasion to right AC.  Patient has extensive erythema to right forearm.  Streaking extending to right upper arm from the [facility]. ? ?Medical Decision Making  ?Medically screening exam initiated at 4:42 PM.  Appropriate orders placed.  George Chavez was informed that the remainder of the evaluation will be completed by another provider, this initial triage assessment does not replace that evaluation, and the importance of remaining in the ED until their evaluation is complete. ? ?Concern for cellulitis. ?  ?Loni Beckwith, PA-C ?03/07/22 1643 ? ?

## 2022-03-07 NOTE — Assessment & Plan Note (Signed)
?  ?#)   Protein calorie malnutrition: Documented history of protein calorie malnutrition, with presenting weight is noted to be 45 kg associated BMI 15.1.  In the context of metastatic prostate cancer, undergoing chemotherapy.  Of note, he is already a month supplementation with Ensure 3 times daily with meals. ?  ?Plan: Check prealbumin level.  Continue aforementioned Ensure supplements.  Daily weights. ?  ?  ?  ?  ?

## 2022-03-07 NOTE — Assessment & Plan Note (Signed)
#)  Severe sepsis due cellulitis of right upper extremity: dx on basis of 2 days of progressive right upper extremity erythema, tenderness, swelling, increased warmth to touch, with plain films of the right upper extremity showed no evidence of acute fracture nor any evidence of right elbow effusion, or evidence of subcutaneous gas.  Additionally these plain films showed no evidence of bone demineralization to suggest osteomyelitis.  ESR/CRP ordered in the ED currently pending.  In tandem with no evidence of crepitus on exam, presentation presents suggestive of necrotizing fasciitis.  Denies any associated purulent discharge, rendering MRSA to be less likely.  Of note, associated extremity is found to be neurovascularly intact.  ?  ?Differential includes sequela of recently placed peripheral IV in right cubital fossa during recent hospitalization, as further detailed above.  No evidence of proximal extension of swelling nor any evidence of refractory hypotension to indicate SVC syndrome, which was initially considered considering patient's history of metastatic cancer. Will also check for extremity venous ultrasound to evaluate for DVT in the setting of his history of malignancy. ?  ?Presentation is associated with the presence of leukocytosis and tachycardia SIRS criteria, therefore criteria for sepsis met. Of note, given the associated presence of suspected end organ damage in the form of concominant presenting lactate of 2.4, criteria are met for pt's sepsis to be considered severe in nature. S/p 30 mL/kg ivf bolus administered with repeat lactate trended down to 2.3. of note given prevously quantified interval increase in wbc , suspect interval nupegen administration.  ?  ?presentation meets criteria to be considered of severe cellulitis due to associated sepsis in addition to patient's immunocompromise state in the context of chemotherapy. Overall, in the setting of presenting severe non-purulent cellulitis,  will continue IV Vanc and Cefepime per recommendation by antibiotic stewardship committee. ?  ?Additional ED work-up/management notable for: Blood cultures x2 collected prior to initiation of IV vancomycin and cefepime. ?  ?No e/o additional infectious process at this time, but will further expand work-up as detailed below.  ?  ?  ?  ?Plan: CBC w/ diff and CMP in AM.  Follow for results of blood cx's x 2. Abx: Continue IV vancomycin and cefepime, as above.  Continuous LR at 100 cc/h, with repeat lactate ordered for the morning.  Check urinalysis.  Check chest x-ray.  Prn IV fentanyl.  Follow-up result ESR/CRP.  Right upper extremity venous ultrasound to evaluate for DVT. I have placed nursing communication orders asking that current distribution of erythema be outlined. Will attempt additional chart review for determination of interval nupegen use.  ?  ?

## 2022-03-07 NOTE — ED Triage Notes (Signed)
Pt here with redness, swelling and blistering to right arm. Family reports pt had an IV with chemo on 3/17 with swelling noted after. Pt then seen and admitted on 3/19 for constipation and admission with an IV placed in same arm. ?

## 2022-03-07 NOTE — Assessment & Plan Note (Signed)
?  ?#)   Hypocalcemia: Adjusted calcium level noted to be mildly low at 8.6. ?  ?Plan: Check serum magnesium level, Phos level.  Repeat ionized calcium, with potential for IV calcium supplementation depending upon results of interval magnesium and subsequent calcium findings.  ?  ?  ?  ?

## 2022-03-07 NOTE — ED Notes (Signed)
Accidentally completed LR infusion order when giving LR Liter bolus. I have stopped it and will restart when I actually begin in the LR infusion after cefipime and other '500mg'$  bolus.  ?

## 2022-03-07 NOTE — Assessment & Plan Note (Signed)
?  ?  ?#)   Type 2 Diabetes Mellitus: documented history of such, managed via lifestyle modifications in the setting of weight loss stemming from metastatic prostate cancer.  Home insulin regimen: None. Home oral hypoglycemic agents: None. presenting blood sugar: 72.  ?  ?  ?Plan: accuchecks QAC and HS with very low dose SSI.  ?  ?  ?

## 2022-03-07 NOTE — Progress Notes (Signed)
Pharmacy Antibiotic Note ? ?George Chavez is a 71 y.o. male admitted on 03/07/2022 with sepsis and possible cellulitis at IV site for chemo 3/17.  PMH with metastatic prostate cancer with bone mets. Pharmacy has been consulted for vancomycin and cefepime dosing. ? ?WBC 44.2 up from 1.5 on 3/24. afebrile.  ? ?3/27 Vancomycin '750mg'$  Q 24 hr ?Scr used: 0.8 mg/dL ?Weight: 45.1 kg ?Vd coeff: 0.72 L/kg ?Est AUC: 475 ? ?Plan: ?Vanc '750mg'$  q24 hr  ?Cefepime 2g q 12hr  ?  ? ?Temp (24hrs), Avg:98.4 ?F (36.9 ?C), Min:98.1 ?F (36.7 ?C), Max:98.7 ?F (37.1 ?C) ? ?Recent Labs  ?Lab 03/01/22 ?8469 03/02/22 ?0219 03/03/22 ?0142 03/04/22 ?0138 03/07/22 ?1701 03/07/22 ?1857  ?WBC  --  15.2* 3.1* 1.5* 44.2*  --   ?CREATININE 0.50* 0.53* 0.54* 0.58* 0.70  --   ?LATICACIDVEN  --   --   --   --  2.4* 2.3*  ?  ?Estimated Creatinine Clearance: 53.2 mL/min (by C-G formula based on SCr of 0.7 mg/dL).   ? ?No Known Allergies ? ?Antimicrobials this admission: ?Vanc  3/27 >>  ?Cefe 3/27 >>  ? ?Dose adjustments this admission: ?N/a ? ?Microbiology results: ?3/27 BCx: pend   ? ?Thank you for allowing pharmacy to be a part of this patient?s care. ? ?Benetta Spar, PharmD, BCPS, BCCP ?Clinical Pharmacist ? ?Please check AMION for all [facility] phone numbers ?After 10:00 PM, call [place] [phone] ? ?

## 2022-03-07 NOTE — ED Provider Notes (Signed)
?[place] ?Provider Note ? ? ?CSN: [id number] ?Arrival date & time: 03/07/22  1552 ? ?  ? ?History ? ?Chief Complaint  ?Patient presents with  ? Arm Swelling  ? ? ?George Chavez is a 71 y.o. male. ? ?HPI ?Patient presents for pain, swelling, and erythema to his right arm.  His medical history includes metastatic prostate cancer, DM2, malnutrition, and constipation.  He does currently get chemotherapy through his oncologist at [facility].  Last chemotherapy was 10 days ago.  This was infused in his right AC.  Following this, he had a small localized area of swelling to his right AC.  He was told that infusion may have infiltrated.  Localized swelling resolved.  Additionally, he had a recent admission to the hospital for constipation.  He was discharged 3 days ago.  During this admission, he did receive IV fluids and medications in an IV placed in his right AC.  2 days ago, he developed pain, swelling, and erythema to the area extending from his right [facility] to his distal anterior right forearm.  Swelling was present throughout his distal right arm, including his hand.  He has not had any known fevers.  He states that he chronically feels chills.  Due to the rapid worsening of swelling and skin findings, patient presents to the ED. ?  ? ?Home Medications ?Prior to Admission medications   ?Medication Sig Start Date End Date Taking? Authorizing Provider  ?acetaminophen (TYLENOL) 500 MG tablet Take 500 mg by mouth every 6 (six) hours as needed for mild pain.   Yes [provider]  ?feeding supplement (ENSURE ENLIVE / ENSURE PLUS) LIQD Take 237 mLs by mouth 3 (three) times daily between meals. 03/04/22  Yes Dahal, Marlowe Aschoff, MD  ?gabapentin (NEURONTIN) 100 MG capsule Take 200 mg by mouth 2 (two) times daily. 02/05/22  Yes [provider]  ?Multiple Vitamins-Minerals (MULTIVITAMIN ADULT) CHEW Chew 1 tablet by mouth daily.   Yes [provider]  ?naloxegol oxalate  (MOVANTIK) 12.5 MG TABS tablet Take 1 tablet (12.5 mg total) by mouth daily. 03/05/22 04/04/22 Yes Dahal, Marlowe Aschoff, MD  ?oxyCODONE (OXY IR/ROXICODONE) 5 MG immediate release tablet Take 5 mg by mouth in the morning and at bedtime. 11/19/21  Yes [provider]  ?senna (SENOKOT) 8.6 MG TABS tablet Take 1 tablet (8.6 mg total) by mouth 2 (two) times daily. 03/04/22 04/03/22 Yes Dahal, Marlowe Aschoff, MD  ?bisacodyl (DULCOLAX) 10 MG suppository Place 1 suppository (10 mg total) rectally daily as needed for up to 12 days for moderate constipation. ?Patient not taking: Reported on 03/07/2022 03/04/22 03/16/22  Terrilee Croak, MD  ?phosphorus (K PHOS NEUTRAL) 654-650-354 MG tablet Take 1 tablet (250 mg total) by mouth daily. ?Patient not taking: Reported on 03/07/2022 03/04/22 04/03/22  Terrilee Croak, MD  ?polyethylene glycol (MIRALAX / GLYCOLAX) 17 g packet Take 17 g by mouth daily as needed. ?Patient not taking: Reported on 03/07/2022 03/04/22 04/03/22  Terrilee Croak, MD  ?   ? ?Allergies    ?Patient has no known allergies.   ? ?Review of Systems   ?Review of Systems  ?Constitutional:  Positive for chills.  ?Musculoskeletal:  Positive for arthralgias and joint swelling.  ?Skin:  Positive for color change.  ?All other systems reviewed and are negative. ? ?Physical Exam ?Updated Vital Signs ?BP 137/77   Pulse 68   Temp 98.1 ?F (36.7 ?C) (Oral)   Resp 14   SpO2 98%  ?Physical Exam ?Vitals and nursing  note reviewed.  ?Constitutional:   ?   General: He is not in acute distress. ?   Appearance: He is well-developed and underweight. He is ill-appearing (Chronically). He is not toxic-appearing or diaphoretic.  ?HENT:  ?   Head: Normocephalic and atraumatic.  ?   Right Ear: External ear normal.  ?   Left Ear: External ear normal.  ?   Nose: Nose normal.  ?Eyes:  ?   Conjunctiva/sclera: Conjunctivae normal.  ?Cardiovascular:  ?   Rate and Rhythm: Normal rate and regular rhythm.  ?   Heart sounds: No murmur heard. ?Pulmonary:  ?   Effort:  Pulmonary effort is normal. No respiratory distress.  ?Abdominal:  ?   Palpations: Abdomen is soft.  ?   Tenderness: There is no abdominal tenderness.  ?Musculoskeletal:     ?   General: Swelling and tenderness present. No deformity. Normal range of motion.  ?   Cervical back: Normal range of motion and neck supple.  ?   Right lower leg: No edema.  ?   Left lower leg: No edema.  ?Skin: ?   General: Skin is warm and dry.  ?   Capillary Refill: Capillary refill takes less than 2 seconds.  ?   Findings: Erythema present.  ?Neurological:  ?   General: No focal deficit present.  ?   Mental Status: He is alert and oriented to person, place, and time.  ?   Cranial Nerves: No cranial nerve deficit.  ?   Sensory: No sensory deficit.  ?   Motor: No weakness.  ?   Coordination: Coordination normal.  ?Psychiatric:     ?   Mood and Affect: Mood normal.     ?   Behavior: Behavior normal.     ?   Thought Content: Thought content normal.     ?   Judgment: Judgment normal.  ? ? ? ?ED Results / Procedures / Treatments   ?Labs ?(all labs ordered are listed, but only abnormal results are displayed) ?Labs Reviewed  ?COMPREHENSIVE METABOLIC PANEL - Abnormal; Notable for the following components:  ?    Result Value  ? Calcium 7.8 (*)   ? Total Protein 5.6 (*)   ? Albumin 2.9 (*)   ? Alkaline Phosphatase 197 (*)   ? All other components within normal limits  ?CBC WITH DIFFERENTIAL/PLATELET - Abnormal; Notable for the following components:  ? WBC 44.2 (*)   ? Hemoglobin 12.0 (*)   ? HCT 37.6 (*)   ? RDW 23.2 (*)   ? Platelets 444 (*)   ? nRBC 0.5 (*)   ? Neutro Abs 37.4 (*)   ? Monocytes Absolute 1.2 (*)   ? Abs Immature Granulocytes 4.42 (*)   ? All other components within normal limits  ?LACTIC ACID, PLASMA - Abnormal; Notable for the following components:  ? Lactic Acid, Venous 2.4 (*)   ? All other components within normal limits  ?LACTIC ACID, PLASMA - Abnormal; Notable for the following components:  ? Lactic Acid, Venous 2.3 (*)   ?  All other components within normal limits  ?CULTURE, BLOOD (ROUTINE X 2)  ?CULTURE, BLOOD (ROUTINE X 2)  ?SEDIMENTATION RATE  ?C-REACTIVE PROTEIN  ?MAGNESIUM  ?MAGNESIUM  ?COMPREHENSIVE METABOLIC PANEL  ?CBC WITH DIFFERENTIAL/PLATELET  ?LACTIC ACID, PLASMA  ?URINALYSIS, COMPLETE (UACMP) WITH MICROSCOPIC  ?PREALBUMIN  ?PHOSPHORUS  ?CALCIUM, IONIZED  ? ? ?EKG ?None ? ?Radiology ?DG Elbow Complete Right ? ?Result Date: 03/07/2022 ?CLINICAL DATA:  Right elbow  pain and swelling. EXAM: RIGHT ELBOW - COMPLETE 3+ VIEW COMPARISON:  None. FINDINGS: There is no acute fracture or dislocation. The bones are well mineralized. No significant arthritic changes. No joint effusion. The soft tissues are unremarkable. IMPRESSION: Negative. Electronically Signed   By: Anner Crete M.D.   On: 03/07/2022 19:41   ? ?Procedures ?Procedures  ? ? ?Medications Ordered in ED ?Medications  ?lactated ringers infusion (0 mLs Intravenous Stopped 03/07/22 2151)  ?ceFEPIme (MAXIPIME) 2 g in sodium chloride 0.9 % 100 mL IVPB (0 g Intravenous Stopped 03/08/22 0034)  ?vancomycin (VANCOREADY) IVPB 750 mg/150 mL (has no administration in time range)  ?naloxone [facility]) injection 0.4 mg (has no administration in time range)  ?fentaNYL (SUBLIMAZE) injection 25 mcg (has no administration in time range)  ?acetaminophen (TYLENOL) tablet 650 mg (has no administration in time range)  ?  Or  ?acetaminophen (TYLENOL) suppository 650 mg (has no administration in time range)  ?insulin aspart (novoLOG) injection 0-6 Units (has no administration in time range)  ?feeding supplement (ENSURE ENLIVE / ENSURE PLUS) liquid 237 mL (has no administration in time range)  ?gabapentin (NEURONTIN) capsule 200 mg (has no administration in time range)  ?naloxegol oxalate (MOVANTIK) tablet 12.5 mg (has no administration in time range)  ?phosphorus (K PHOS NEUTRAL) tablet 250 mg (has no administration in time range)  ?polyethylene glycol (MIRALAX / GLYCOLAX) packet 17 g (has no  administration in time range)  ?senna (SENOKOT) tablet 8.6 mg (8.6 mg Oral Given 03/08/22 0136)  ?ondansetron (ZOFRAN) injection 4 mg (has no administration in time range)  ?morphine (PF) 4 MG/ML injection 4 mg (4 mg Int

## 2022-03-07 NOTE — H&P (Signed)
?History and Physical  ? ? ?PLEASE NOTE THAT DRAGON DICTATION SOFTWARE WAS USED IN THE CONSTRUCTION OF THIS NOTE. ? ? ?MELL GUIA UMP:[id number] DOB: [birth year] DOA: 03/07/2022 ? ?PCP: [facility]  ?Patient coming from: home  ? ?I have personally briefly reviewed patient's old medical records in [place] ? ?Chief Complaint: Right upper extremity erythema ? ?HPI: George Chavez is a 71 y.o. male with medical history significant for prostate cancer with metastasis to the bone, type 2 diabetes mellitus managed via lifestyle modifications, protein calorie malnutrition, who is admitted to [facility] on 03/07/2022 with severe sepsis due to right upper extremity cellulitis after presenting from home to [facility] ED complaining of  right upper extremity erythema.  ? ?The following history is provided by the patient as well as my discussions with the EDP, and via chart review. ? ?The patient reports 2 days of new onset right upper extremity erythema associated with swelling, tenderness, increased warmth to the touch, but denies any associated drainage, including no purulent drainage.  Also denies any associated crepitus, numbness, or paresthesias.  Denies erythema in any other location.  Reports that right upper extremity erythema is around the cubital fossa, with distal extension of erythema/swelling terminating proximal to the right wrist.  Lower extremity erythema, edema, or acute calf tenderness.  Denies any proximal extension of the right cubital fossa erythema/swelling, also denies any neck swelling. denies any associated subjective fever, chills, rigors, or generalized myalgias.  No recent chest pain, shortness of breath, palpitations, diaphoresis, nausea, vomiting, dizziness, presyncope, syncope.  No cough, new onset abdominal pain, or diarrhea.  He also denies any recent dysuria, gross hematuria, or change in urinary urgency/frequency.  No headache or neck stiffness. ? ?The patient was  recently hospitalized at [facility] from 02/27/2022 to 03/04/2022 for constipation associated with opioid use in the setting of his history of prostate cancer with metastasis to the bone.  During this recent prior hospitalization, he reportedly had a peripheral IV in the right cubital fossa.  Otherwise, he denies any recent trauma to the right upper extremity.  additionally, he reportedly underwent chemotherapy during this most recent hospitalization, with most recent prior will cell count noted to be 1.5 on 03/04/2022.  No known history of any prior DVT/PE. ? ? ? ? ?ED Course:  ?Vital signs in the ED were notable for the following: Afebrile; heart rate 73-92; initial blood pressure 93/62, with subsequent increase to 120/75 following interval IV fluid bolus, as further detailed below; respiratory rate 16, oxygen saturation 98 to 100% on room air. ? ?Labs were notable for the following: CMP notable for the following: Bicarbonate 22, creatinine 0.70 compared to most recent prior value 0.58 on 03/04/2022, glucose 72, calcium adjusted for mild hypoalbuminemia 8.6, albumin 2.9, alkaline phosphatase 197 otherwise liver enzymes within normal limits.  CBC notable for will blood cell count 44,000 with 84% neutrophils, hemoglobin 12, platelet count 444.  Initial lactate 2.4, with repeat trending down to 2.3.  ESR/CRP ordered in the ED, with results currently pending.  Blood cultures x2 collected prior to initiation of IV antibiotics.  ? ?Imaging and additional notable ED work-up: Plain films of the right elbow showed no evidence of acute fracture or dislocation, no evidence of significant arthritic changes, no evidence of right elbow joint effusion, nor any evidence of subcutaneous gas. ? ?While in the ED, the following were administered: Morphine 4 mg IV x1, cefepime, IV vancomycin, lactated Ringer's x1.5 L bolus followed  by continuous LR at 100 cc/h. ? ?Subsequently, the patient was admitted for further evaluation management of  severe sepsis due to right upper extremity cellulitis.  ? ? ? ?Review of Systems: As per HPI otherwise 10 point review of systems negative.  ? ?Past Medical History:  ?Diagnosis Date  ? DM2 (diabetes mellitus, type 2) ([place])   ? Prostate cancer ([place])   ? Protein calorie malnutrition ([place])   ? ? ?Past Surgical History:  ?Procedure Laterality Date  ? FLEXIBLE SIGMOIDOSCOPY N/A 03/03/2022  ? Procedure: FLEXIBLE SIGMOIDOSCOPY;  Surgeon: Carol Ada, MD;  Location: [place];  Service: Gastroenterology;  Laterality: N/A;  Severe proctalgia rule out stercoral ulcer vs anal fissure  ? ? ?Social History: ? reports that he has been smoking cigarettes. He has been smoking an average of .25 packs per day. He has never used smokeless tobacco. He reports that he does not currently use alcohol. He reports that he does not use drugs. ? ? ?No Known Allergies ? ?History reviewed. No pertinent family history. ? ?Family history reviewed and not pertinent  ? ? ?Prior to Admission medications   ?Medication Sig Start Date End Date Taking? Authorizing Provider  ?bisacodyl (DULCOLAX) 10 MG suppository Place 1 suppository (10 mg total) rectally daily as needed for up to 12 days for moderate constipation. 03/04/22 03/16/22  Terrilee Croak, MD  ?feeding supplement (ENSURE ENLIVE / ENSURE PLUS) LIQD Take 237 mLs by mouth 3 (three) times daily between meals. 03/04/22   Terrilee Croak, MD  ?gabapentin (NEURONTIN) 100 MG capsule Take 200 mg by mouth 2 (two) times daily. 02/05/22   [provider]  ?Multiple Vitamins-Minerals (MULTIVITAMIN ADULT) CHEW Chew 1 tablet by mouth daily.    [provider]  ?naloxegol oxalate (MOVANTIK) 12.5 MG TABS tablet Take 1 tablet (12.5 mg total) by mouth daily. 03/05/22 04/04/22  Terrilee Croak, MD  ?oxyCODONE (OXY IR/ROXICODONE) 5 MG immediate release tablet Take 5 mg by mouth in the morning and at bedtime. 11/19/21   [provider]  ?phosphorus (K PHOS NEUTRAL) 155-852-130 MG tablet Take 1  tablet (250 mg total) by mouth daily. 03/04/22 04/03/22  Terrilee Croak, MD  ?polyethylene glycol (MIRALAX / GLYCOLAX) 17 g packet Take 17 g by mouth daily as needed. 03/04/22 04/03/22  Terrilee Croak, MD  ?senna (SENOKOT) 8.6 MG TABS tablet Take 1 tablet (8.6 mg total) by mouth 2 (two) times daily. 03/04/22 04/03/22  Terrilee Croak, MD  ? ? ? ?Objective  ? ? ?Physical Exam: ?Vitals:  ? 03/07/22 1635 03/07/22 1942 03/07/22 2053  ?BP: 94/72 93/62 120/75  ?Pulse: 92 73   ?Resp: 12 16   ?Temp: 98.1 ?F (36.7 ?C) 98.7 ?F (37.1 ?C)   ?TempSrc: Oral Oral   ?SpO2: 98% 100%   ? ? ?General: appears to be stated age; alert, oriented ?Skin: warm, dry, no rash ?Head:  AT/[place] ?Mouth:  Oral mucosa membranes appear dry, normal dentition ?Neck: supple; trachea midline ?Heart:  RRR; did not appreciate any M/R/G ?Lungs: CTAB, did not appreciate any wheezes, rales, or rhonchi ?Abdomen: + BS; soft, ND, NT ?Vascular: 2+ pedal pulses b/l; 2+ radial pulses b/l ?Extremities: Right upper extremity erythema starting at the right cubital fossa and extending distally, with termination distal to the right wrist and associated with swelling, tenderness, increased warmth to touch no peripheral edema, no muscle wasting; ROM of b/l elbows preserved. ?Neuro: strength and sensation intact in upper and lower extremities b/l ? ? ?Labs on Admission: I have personally reviewed  following labs and imaging studies ? ?CBC: ?Recent Labs  ?Lab 03/02/22 ?0219 03/03/22 ?0142 03/04/22 ?0138 03/07/22 ?1701  ?WBC 15.2* 3.1* 1.5* 44.2*  ?NEUTROABS 14.7*  --   --  37.4*  ?HGB 9.2* 8.6* 9.2* 12.0*  ?HCT 28.0* 26.6* 28.1* 37.6*  ?MCV 80.2 80.1 80.7 82.3  ?PLT 217 229 240 444*  ? ?Basic Metabolic Panel: ?Recent Labs  ?Lab 03/01/22 ?0353 03/02/22 ?0219 03/03/22 ?0142 03/04/22 ?0138 03/07/22 ?1701  ?NA 134* 133* 135 138 138  ?K 3.9 4.5 4.4 3.8 3.6  ?CL 102 104 109 109 104  ?CO2 23 22 21* 24 22  ?GLUCOSE 62* 84 79 76 72  ?BUN 10 10 8  6* 10  ?CREATININE 0.50* 0.53* 0.54* 0.58* 0.70   ?CALCIUM 7.3* 7.1* 7.5* 7.4* 7.8*  ?MG  --   --  2.1 1.9  --   ?PHOS 1.6* 2.2* 1.8* 1.7*  --   ? ?GFR: ?Estimated Creatinine Clearance: 53.2 mL/min (by C-G formula based on SCr of 0.7 mg/dL). ?L[facility] Functi

## 2022-03-08 ENCOUNTER — Inpatient Hospital Stay (HOSPITAL_COMMUNITY): Payer: Medicare HMO

## 2022-03-08 DIAGNOSIS — R609 Edema, unspecified: Secondary | ICD-10-CM

## 2022-03-08 DIAGNOSIS — T402X5A Adverse effect of other opioids, initial encounter: Secondary | ICD-10-CM | POA: Diagnosis not present

## 2022-03-08 DIAGNOSIS — L03113 Cellulitis of right upper limb: Secondary | ICD-10-CM | POA: Diagnosis not present

## 2022-03-08 LAB — URINALYSIS, COMPLETE (UACMP) WITH MICROSCOPIC
Bacteria, UA: NONE SEEN
Bilirubin Urine: NEGATIVE
Glucose, UA: NEGATIVE mg/dL
Hgb urine dipstick: NEGATIVE
Ketones, ur: NEGATIVE mg/dL
Leukocytes,Ua: NEGATIVE
Nitrite: NEGATIVE
Protein, ur: NEGATIVE mg/dL
Specific Gravity, Urine: 1.015 (ref 1.005–1.030)
pH: 5 (ref 5.0–8.0)

## 2022-03-08 LAB — PHOSPHORUS: Phosphorus: 1.8 mg/dL — ABNORMAL LOW (ref 2.5–4.6)

## 2022-03-08 LAB — CBC WITH DIFFERENTIAL/PLATELET
Abs Immature Granulocytes: 3.26 10*3/uL — ABNORMAL HIGH (ref 0.00–0.07)
Basophils Absolute: 0.2 10*3/uL — ABNORMAL HIGH (ref 0.0–0.1)
Basophils Relative: 1 %
Eosinophils Absolute: 0 10*3/uL (ref 0.0–0.5)
Eosinophils Relative: 0 %
HCT: 28.8 % — ABNORMAL LOW (ref 39.0–52.0)
Hemoglobin: 9.7 g/dL — ABNORMAL LOW (ref 13.0–17.0)
Immature Granulocytes: 11 %
Lymphocytes Relative: 2 %
Lymphs Abs: 0.7 10*3/uL (ref 0.7–4.0)
MCH: 26.8 pg (ref 26.0–34.0)
MCHC: 33.7 g/dL (ref 30.0–36.0)
MCV: 79.6 fL — ABNORMAL LOW (ref 80.0–100.0)
Monocytes Absolute: 0.8 10*3/uL (ref 0.1–1.0)
Monocytes Relative: 3 %
Neutro Abs: 25.4 10*3/uL — ABNORMAL HIGH (ref 1.7–7.7)
Neutrophils Relative %: 83 %
Platelets: 361 10*3/uL (ref 150–400)
RBC: 3.62 MIL/uL — ABNORMAL LOW (ref 4.22–5.81)
RDW: 22.7 % — ABNORMAL HIGH (ref 11.5–15.5)
Smear Review: ADEQUATE
WBC: 30.4 10*3/uL — ABNORMAL HIGH (ref 4.0–10.5)
nRBC: 0.5 % — ABNORMAL HIGH (ref 0.0–0.2)

## 2022-03-08 LAB — COMPREHENSIVE METABOLIC PANEL
ALT: 10 U/L (ref 0–44)
AST: 17 U/L (ref 15–41)
Albumin: 2.2 g/dL — ABNORMAL LOW (ref 3.5–5.0)
Alkaline Phosphatase: 125 U/L (ref 38–126)
Anion gap: 5 (ref 5–15)
BUN: 8 mg/dL (ref 8–23)
CO2: 29 mmol/L (ref 22–32)
Calcium: 7.3 mg/dL — ABNORMAL LOW (ref 8.9–10.3)
Chloride: 106 mmol/L (ref 98–111)
Creatinine, Ser: 0.64 mg/dL (ref 0.61–1.24)
GFR, Estimated: >60 mL/min (ref >60)
Glucose, Bld: 72 mg/dL (ref 70–99)
Potassium: 3.3 mmol/L — ABNORMAL LOW (ref 3.5–5.1)
Sodium: 140 mmol/L (ref 135–145)
Total Bilirubin: 0.7 mg/dL (ref 0.3–1.2)
Total Protein: 4.2 g/dL — ABNORMAL LOW (ref 6.5–8.1)

## 2022-03-08 LAB — LACTIC ACID, PLASMA: Lactic Acid, Venous: 1.1 mmol/L (ref 0.5–1.9)

## 2022-03-08 LAB — PREALBUMIN: Prealbumin: 8.4 mg/dL — ABNORMAL LOW (ref 18–38)

## 2022-03-08 LAB — GLUCOSE, CAPILLARY
Glucose-Capillary: 158 mg/dL — ABNORMAL HIGH (ref 70–99)
Glucose-Capillary: 116 mg/dL — ABNORMAL HIGH (ref 70–99)

## 2022-03-08 LAB — MAGNESIUM: Magnesium: 1.7 mg/dL (ref 1.7–2.4)

## 2022-03-08 LAB — C-REACTIVE PROTEIN: CRP: 0.8 mg/dL (ref <1.0)

## 2022-03-08 LAB — CBG MONITORING, ED
Glucose-Capillary: 66 mg/dL — ABNORMAL LOW (ref 70–99)
Glucose-Capillary: 113 mg/dL — ABNORMAL HIGH (ref 70–99)
Glucose-Capillary: 112 mg/dL — ABNORMAL HIGH (ref 70–99)

## 2022-03-08 MED ORDER — OXYCODONE HCL 5 MG PO TABS: 5.0000 mg | ORAL_TABLET | Freq: Two times a day (BID) | ORAL | Status: DC | PRN

## 2022-03-08 MED ORDER — VANCOMYCIN HCL 750 MG/150ML IV SOLN
750.0000 mg | INTRAVENOUS | Status: DC
  Administered 2022-03-09 – 2022-03-10 (×2): 750 mg via INTRAVENOUS
  Filled 2022-03-08 (×3): qty 150

## 2022-03-08 NOTE — Plan of Care (Signed)
  Problem: Nutrition: Goal: Adequate nutrition will be maintained Outcome: Progressing   Problem: Elimination: Goal: Will not experience complications related to urinary retention Outcome: Progressing   

## 2022-03-08 NOTE — ED Notes (Signed)
Pt CBG check by this tech. Helped pt with a urinal to receive a clean urine sample. Reposition pt in bed. Made sure call light was in place  ?

## 2022-03-08 NOTE — ED Notes (Signed)
Patient transported to [facility] ?

## 2022-03-08 NOTE — Progress Notes (Addendum)
Right upper extremity venous duplex completed. ?Refer to "CV Proc" under chart review to view preliminary results. ? ?03/08/2022 11:04 AM ?Kelby Aline., MHA, RVT, RDCS, RDMS   ?

## 2022-03-08 NOTE — Progress Notes (Signed)
?Progress note ? ? ?Chavez George UUV:[id number] DOB: 1951-03-21 DOA: 03/07/2022 ? ?PCP: [facility]  ?Patient coming from: home  ? ?Chief Complaint: Right upper extremity erythema ? ?HPI: George Chavez is a 71 y.o. male with medical history significant for prostate cancer with metastasis to the bone, type 2 diabetes mellitus managed via lifestyle modifications, protein calorie malnutrition, who is admitted to [facility] on 03/07/2022 with severe sepsis due to right upper extremity cellulitis after presenting from home to [facility] ED complaining of  right upper extremity erythema.  ? ?The following history is provided by the patient as well as my discussions with the EDP, and via chart review. ? ?The patient reports 2 days of new onset right upper extremity erythema associated with swelling, tenderness, increased warmth to the touch, but denies any associated drainage, including no purulent drainage.  Also denies any associated crepitus, numbness, or paresthesias.  Denies erythema in any other location.  Reports that right upper extremity erythema is around the cubital fossa, with distal extension of erythema/swelling terminating proximal to the right wrist.  Lower extremity erythema, edema, or acute calf tenderness.  Denies any proximal extension of the right cubital fossa erythema/swelling, also denies any neck swelling. denies any associated subjective fever, chills, rigors, or generalized myalgias.  No recent chest pain, shortness of breath, palpitations, diaphoresis, nausea, vomiting, dizziness, presyncope, syncope.  No cough, new onset abdominal pain, or diarrhea.  He also denies any recent dysuria, gross hematuria, or change in urinary urgency/frequency.  No headache or neck stiffness. ? ?The patient was recently hospitalized at [facility] from 02/27/2022 to 03/04/2022 for constipation associated with opioid use in the setting of his history of prostate cancer with metastasis to the bone.   During this recent prior hospitalization, he reportedly had a peripheral IV in the right cubital fossa.  Otherwise, he denies any recent trauma to the right upper extremity.  additionally, he reportedly underwent chemotherapy during this most recent hospitalization, with most recent prior will cell count noted to be 1.5 on 03/04/2022.  No known history of any prior DVT/PE. ? ? ?ED Course:  ?Vital signs in the ED were notable for the following: Afebrile; heart rate 73-92; initial blood pressure 93/62, with subsequent increase to 120/75 following interval IV fluid bolus, as further detailed below; respiratory rate 16, oxygen saturation 98 to 100% on room air. ? ?Labs were notable for the following: CMP notable for the following: Bicarbonate 22, creatinine 0.70 compared to most recent prior value 0.58 on 03/04/2022, glucose 72, calcium adjusted for mild hypoalbuminemia 8.6, albumin 2.9, alkaline phosphatase 197 otherwise liver enzymes within normal limits.  CBC notable for will blood cell count 44,000 with 84% neutrophils, hemoglobin 12, platelet count 444.  Initial lactate 2.4, with repeat trending down to 2.3.  ESR/CRP ordered in the ED, with results currently pending.  Blood cultures x2 collected prior to initiation of IV antibiotics.  ? ?Imaging and additional notable ED work-up: Plain films of the right elbow showed no evidence of acute fracture or dislocation, no evidence of significant arthritic changes, no evidence of right elbow joint effusion, nor any evidence of subcutaneous gas. ? ?While in the ED, the following were administered: Morphine 4 mg IV x1, cefepime, IV vancomycin, lactated Ringer's x1.5 L bolus followed by continuous LR at 100 cc/h. ? ?Subsequently, the patient was admitted for further evaluation management of severe sepsis due to right upper extremity cellulitis.  ? ? ?Objective  ? ? ?Physical Exam: ?  Vitals:  ? 03/08/22 0600 03/08/22 0630 03/08/22 0700 03/08/22 0930  ?BP: 122/68 131/80 104/66  110/67  ?Pulse: 72 (!) 59 87 78  ?Resp: (!) 6 11 (!) 21 16  ?Temp:      ?TempSrc:      ?SpO2: 97% 98% 97% 97%  ? ? ?General: appears to be stated age; alert, oriented ?Skin: warm, dry, no rash ?Head:  AT/[employer] ?Mouth:  Oral mucosa membranes appear dry, normal dentition ?Neck: supple; trachea midline ?Heart:  RRR; did not appreciate any M/R/G ?Lungs: CTAB, did not appreciate any wheezes, rales, or rhonchi ?Abdomen: + BS; soft, ND, NT ?Vascular: 2+ pedal pulses b/l; 2+ radial pulses b/l ?Extremities: Right upper extremity erythema starting at the right cubital fossa and extending distally, with termination distal to the right wrist and associated with swelling, tenderness, increased warmth to touch no peripheral edema, no muscle wasting; ROM of b/l elbows preserved. ?Neuro: strength and sensation intact in upper and lower extremities b/l ? ? ?Labs on Admission: I have personally reviewed following labs and imaging studies ? ?CBC: ?Recent Labs  ?Lab 03/02/22 ?0219 03/03/22 ?0142 03/04/22 ?0138 03/07/22 ?1701 03/08/22 ?3785  ?WBC 15.2* 3.1* 1.5* 44.2* 30.4*  ?NEUTROABS 14.7*  --   --  37.4* 25.4*  ?HGB 9.2* 8.6* 9.2* 12.0* 9.7*  ?HCT 28.0* 26.6* 28.1* 37.6* 28.8*  ?MCV 80.2 80.1 80.7 82.3 79.6*  ?PLT 217 229 240 444* 361  ? ?Basic Metabolic Panel: ?Recent Labs  ?Lab 03/02/22 ?0219 03/03/22 ?0142 03/04/22 ?0138 03/07/22 ?1701 03/08/22 ?8850  ?NA 133* 135 138 138 140  ?K 4.5 4.4 3.8 3.6 3.3*  ?CL 104 109 109 104 106  ?CO2 22 21* $Remo'24 22 29  'JZfrj$ ?GLUCOSE 84 79 76 72 72  ?BUN 10 8 6* 10 8  ?CREATININE 0.53* 0.54* 0.58* 0.70 0.64  ?CALCIUM 7.1* 7.5* 7.4* 7.8* 7.3*  ?MG  --  2.1 1.9  --  1.7  ?PHOS 2.2* 1.8* 1.7*  --  1.8*  ? ?GFR: ?Estimated Creatinine Clearance: 53.2 mL/min (by C-G formula based on SCr of 0.64 mg/dL). ?Liver Function Tests: ?Recent Labs  ?Lab 03/02/22 ?0219 03/07/22 ?1701 03/08/22 ?2774  ?AST  --  24 17  ?ALT  --  12 10  ?ALKPHOS  --  197* 125  ?BILITOT  --  0.7 0.7  ?PROT  --  5.6* 4.2*  ?ALBUMIN 2.3* 2.9* 2.2*   ? ?No results for input(s): LIPASE, AMYLASE in the last 168 hours. ?No results for input(s): AMMONIA in the last 168 hours. ?Coagulation Profile: ?No results for input(s): INR, PROTIME in the last 168 hours. ?Cardiac Enzymes: ?No results for input(s): CKTOTAL, CKMB, CKMBINDEX, TROPONINI in the last 168 hours. ?BNP (last 3 results) ?No results for input(s): PROBNP in the last 8760 hours. ?HbA1C: ?No results for input(s): HGBA1C in the last 72 hours. ?CBG: ?Recent Labs  ?Lab 03/08/22 ?1287 03/08/22 ?8676  ?GLUCAP 66* 113*  ? ?Lipid Profile: ?No results for input(s): CHOL, HDL, LDLCALC, TRIG, CHOLHDL, LDLDIRECT in the last 72 hours. ?Thyroid Function Tests: ?No results for input(s): TSH, T4TOTAL, FREET4, T3FREE, THYROIDAB in the last 72 hours. ?Anemia Panel: ?No results for input(s): VITAMINB12, FOLATE, FERRITIN, TIBC, IRON, RETICCTPCT in the last 72 hours. ?Urine analysis: ?   ?Component Value Date/Time  ? COLORURINE YELLOW 03/08/2022 7209  ? APPEARANCEUR CLEAR 03/08/2022 4709  ? LABSPEC 1.015 03/08/2022 0822  ? PHURINE 5.0 03/08/2022 0822  ? GLUCOSEU NEGATIVE 03/08/2022 0822  ? [place] NEGATIVE 03/08/2022 0822  ? BILIRUBINUR NEGATIVE  03/08/2022 5258  ? [place] NEGATIVE 03/08/2022 9483  ? [place] NEGATIVE 03/08/2022 4758  ? NITRITE NEGATIVE 03/08/2022 3074  ? LEUKOCYTESUR NEGATIVE 03/08/2022 6002  ? ? ?Radiological Exams on Admission: ?DG Elbow Complete Right ? ?Result Date: 03/07/2022 ?CLINICAL DATA:  Right elbow pain and swelling. EXAM: RIGHT ELBOW - COMPLETE 3+ VIEW COMPARISON:  None. FINDINGS: There is no acute fracture or dislocation. The bones are well mineralized. No significant arthritic changes. No joint effusion. The soft tissues are unremarkable. IMPRESSION: Negative. Electronically Signed   By: Anner Crete M.D.   On: 03/07/2022 19:41  ? ?DG CHEST PORT 1 VIEW ? ?Result Date: 03/08/2022 ?CLINICAL DATA:  71 year old male with sepsis. EXAM: PORTABLE CHEST 1 VIEW COMPARISON:  Portable chest 02/27/2022 and  earlier. FINDINGS: Portable AP semi upright view at 0514 hours. Lung volumes and mediastinal contours remain normal. Visualized tracheal air column is within normal limits. No pneumothorax, pulmonary edema or

## 2022-03-08 NOTE — Progress Notes (Signed)
Attempted right upper extremity venous duplex, however patient is eating at this time. Will attempt again as schedule permits. ? ?03/08/2022 8:38 AM ?Kelby Aline., MHA, RVT, RDCS, RDMS   ?

## 2022-03-08 NOTE — ED Notes (Signed)
ED TO INPATIENT HANDOFF REPORT ? ?ED Nurse Name and Phone #: Baxter Flattery, RN ? ?S ?Name/Age/Gender ?George Chavez ?71 y.o. ?male ?Room/Bed: 042C/042C ? ?Code Status ?  Code Status: Partial Code ? ?Home/SNF/Other ?Home ?Patient oriented to: self, place, time, and situation ?Is this baseline? Yes  ? ?Triage Complete: Triage complete  ?Chief Complaint ?Cellulitis of right upper extremity [L03.113] ? ?Triage Note ?Pt here with redness, swelling and blistering to right arm. Family reports pt had an IV with chemo on 3/17 with swelling noted after. Pt then seen and admitted on 3/19 for constipation and admission with an IV placed in same arm.  ? ?Allergies ?No Known Allergies ? ?Level of Care/Admitting Diagnosis ?ED Disposition   ? ? ED Disposition  ?Admit  ? Condition  ?--  ? Comment  ?Hospital Area: [facility] [[id number]] ? Level of Care: Telemetry Medical [104] ? May admit patient to [facility] or [facility] if equivalent level of care is available:: No ? Covid Evaluation: Asymptomatic - no recent exposure (last 10 days) testing not required ? Diagnosis: Cellulitis of right upper extremity [295284] ? Admitting Physician: Rhetta Mura [[id number]] ? Attending Physician: Rhetta Mura [[id number]] ? Estimated length of stay: past midnight tomorrow ? Certification:: I certify this patient will need inpatient services for at least 2 midnights ?  ?  ? ?  ? ? ?B ?Medical/Surgery History ?Past Medical History:  ?Diagnosis Date  ? DM2 (diabetes mellitus, type 2) ([place])   ? Prostate cancer ([place])   ? Protein calorie malnutrition ([place])   ? ?Past Surgical History:  ?Procedure Laterality Date  ? FLEXIBLE SIGMOIDOSCOPY N/A 03/03/2022  ? Procedure: FLEXIBLE SIGMOIDOSCOPY;  Surgeon: Carol Ada, MD;  Location: [facility];  Service: Gastroenterology;  Laterality: N/A;  Severe proctalgia rule out stercoral ulcer vs anal fissure  ?  ? ?A ?IV Location/Drains/Wounds ?Patient Lines/Drains/Airways Status   ? ? Active  Line/Drains/Airways   ? ? Name Placement date Placement time Site Days  ? Peripheral IV 03/07/22 20 G Anterior;Left;Upper Arm 03/07/22  1855  Arm  1  ? Pressure Injury 02/28/22 Sacrum Medial Stage 2 -  Partial thickness loss of dermis presenting as a shallow open injury with a red, pink wound bed without slough. 02/28/22  0730  -- 8  ? ?  ?  ? ?  ? ? ?Intake/Output Last 24 hours ? ?Intake/Output Summary (Last 24 hours) at 03/08/2022 1526 ?Last data filed at 03/08/2022 1059 ?Gross per 24 hour  ?Intake 1899.25 ml  ?Output 1000 ml  ?Net 899.25 ml  ? ? ?Labs/Imaging ?Results for orders placed or performed during the hospital encounter of 03/07/22 (from the past 48 hour(s))  ?Blood culture (routine x 2)     Status: None (Preliminary result)  ? Collection Time: 03/07/22  4:47 PM  ? Specimen: BLOOD  ?Result Value Ref Range  ? Specimen Description BLOOD SITE NOT SPECIFIED   ? Special Requests    ?  BOTTLES DRAWN AEROBIC AND ANAEROBIC Blood Culture results may not be optimal due to an inadequate volume of blood received in culture bottles  ? Culture    ?  NO GROWTH < 24 HOURS ?Performed at [facility], [place] [address], [place], [place] [address] ?  ? Report Status PENDING   ?Comprehensive metabolic panel     Status: Abnormal  ? Collection Time: 03/07/22  5:01 PM  ?Result Value Ref Range  ? Sodium 138 135 - 145 mmol/L  ?  Potassium 3.6 3.5 - 5.1 mmol/L  ? Chloride 104 98 - 111 mmol/L  ? CO2 22 22 - 32 mmol/L  ? Glucose, Bld 72 70 - 99 mg/dL  ?  Comment: Glucose reference range applies only to samples taken after fasting for at least 8 hours.  ? BUN 10 8 - 23 mg/dL  ? Creatinine, Ser 0.70 0.61 - 1.24 mg/dL  ? Calcium 7.8 (L) 8.9 - 10.3 mg/dL  ? Total Protein 5.6 (L) 6.5 - 8.1 g/dL  ? Albumin 2.9 (L) 3.5 - 5.0 g/dL  ? AST 24 15 - 41 U/L  ? ALT 12 0 - 44 U/L  ? Alkaline Phosphatase 197 (H) 38 - 126 U/L  ? Total Bilirubin 0.7 0.3 - 1.2 mg/dL  ? GFR, Estimated >60 >60 mL/min  ?  Comment: (NOTE) ?Calculated using the CKD-EPI  Creatinine Equation (2021) ?  ? Anion gap 12 5 - 15  ?  Comment: Performed at [facility], [place] [address], [place], [place] [address]  ?CBC with Differential     Status: Abnormal  ? Collection Time: 03/07/22  5:01 PM  ?Result Value Ref Range  ? WBC 44.2 (H) 4.0 - 10.5 K/uL  ? RBC 4.57 4.22 - 5.81 MIL/uL  ? Hemoglobin 12.0 (L) 13.0 - 17.0 g/dL  ? HCT 37.6 (L) 39.0 - 52.0 %  ? MCV 82.3 80.0 - 100.0 fL  ? MCH 26.3 26.0 - 34.0 pg  ? MCHC 31.9 30.0 - 36.0 g/dL  ? RDW 23.2 (H) 11.5 - 15.5 %  ? Platelets 444 (H) 150 - 400 K/uL  ? nRBC 0.5 (H) 0.0 - 0.2 %  ? Neutrophils Relative % 84 %  ? Neutro Abs 37.4 (H) 1.7 - 7.7 K/uL  ? Lymphocytes Relative 3 %  ? Lymphs Abs 1.2 0.7 - 4.0 K/uL  ? Monocytes Relative 3 %  ? Monocytes Absolute 1.2 (H) 0.1 - 1.0 K/uL  ? Eosinophils Relative 0 %  ? Eosinophils Absolute 0.0 0.0 - 0.5 K/uL  ? Basophils Relative 0 %  ? Basophils Absolute 0.0 0.0 - 0.1 K/uL  ? Immature Granulocytes 10 %  ? Abs Immature Granulocytes 4.42 (H) 0.00 - 0.07 K/uL  ? Polychromasia PRESENT   ?  Comment: Performed at [facility], [place] [address], [place], [place] [address]  ?Lactic acid, plasma     Status: Abnormal  ? Collection Time: 03/07/22  5:01 PM  ?Result Value Ref Range  ? Lactic Acid, Venous 2.4 (HH) 0.5 - 1.9 mmol/L  ?  Comment: CRITICAL RESULT CALLED TO, READ BACK BY AND VERIFIED WITH: ?Joan Flores 3716 03/07/2022 WBOND ?Performed at [facility], [place] [address], [place], [place] [address] ?  ?Sedimentation rate     Status: None  ? Collection Time: 03/07/22  5:01 PM  ?Result Value Ref Range  ? Sed Rate 5 0 - 16 mm/hr  ?  Comment: Performed at [facility], [place] [address], [place], [place] [address]  ?Blood culture (routine x 2)     Status: None (Preliminary result)  ? Collection Time: 03/07/22  6:33 PM  ? Specimen: BLOOD  ?Result Value Ref Range  ? Specimen Description BLOOD SITE NOT SPECIFIED   ? Special Requests    ?  BOTTLES DRAWN AEROBIC AND ANAEROBIC Blood Culture results may not  be optimal due to an inadequate volume of blood received in culture bottles  ? Culture    ?  NO GROWTH <  24 HOURS ?Performed at [facility], [place] [address], [place], [place] [address] ?  ? Report Status PENDING   ?Lactic acid, plasma     Status: Abnormal  ? Collection Time: 03/07/22  6:57 PM  ?Result Value Ref Range  ? Lactic Acid, Venous 2.3 (HH) 0.5 - 1.9 mmol/L  ?  Comment: CRITICAL VALUE NOTED.  VALUE IS CONSISTENT WITH PREVIOUSLY REPORTED AND CALLED VALUE. ?Performed at [facility], [place] [address], [place], [place] [address] ?  ?Magnesium     Status: None  ? Collection Time: 03/08/22  6:23 AM  ?Result Value Ref Range  ? Magnesium 1.7 1.7 - 2.4 mg/dL  ?  Comment: Performed at [facility], [place] [address], [place], [place] [address]  ?Comprehensive metabolic panel     Status: Abnormal  ? Collection Time: 03/08/22  6:23 AM  ?Result Value Ref Range  ? Sodium 140 135 - 145 mmol/L  ? Potassium 3.3 (L) 3.5 - 5.1 mmol/L  ? Chloride 106 98 - 111 mmol/L  ? CO2 29 22 - 32 mmol/L  ? Glucose, Bld 72 70 - 99 mg/dL  ?  Comment: Glucose reference range applies only to samples taken after fasting for at least 8 hours.  ? BUN 8 8 - 23 mg/dL  ? Creatinine, Ser 0.64 0.61 - 1.24 mg/dL  ? Calcium 7.3 (L) 8.9 - 10.3 mg/dL  ? Total Protein 4.2 (L) 6.5 - 8.1 g/dL  ? Albumin 2.2 (L) 3.5 - 5.0 g/dL  ? AST 17 15 - 41 U/L  ? ALT 10 0 - 44 U/L  ? Alkaline Phosphatase 125 38 - 126 U/L  ? Total Bilirubin 0.7 0.3 - 1.2 mg/dL  ? GFR, Estimated >60 >60 mL/min  ?  Comment: (NOTE) ?Calculated using the CKD-EPI Creatinine Equation (2021) ?  ? Anion gap 5 5 - 15  ?  Comment: Performed at [facility], [place] [address], [place], [place] [address]  ?CBC with Differential/Platelet     Status: Abnormal  ? Collection Time: 03/08/22  6:23 AM  ?Result Value Ref Range  ? WBC 30.4 (H) 4.0 - 10.5 K/uL  ? RBC 3.62 (L) 4.22 - 5.81 MIL/uL  ? Hemoglobin 9.7 (L) 13.0 - 17.0 g/dL  ? HCT 28.8 (L) 39.0 - 52.0 %  ? MCV 79.6 (L) 80.0 - 100.0 fL   ? MCH 26.8 26.0 - 34.0 pg  ? MCHC 33.7 30.0 - 36.0 g/dL  ? RDW 22.7 (H) 11.5 - 15.5 %  ? Platelets 361 150 - 400 K/uL  ? nRBC 0.5 (H) 0.0 - 0.2 %  ? Neutrophils Relative % 83 %  ? Neutro Abs 25.4 (H) 1.7 - 7.

## 2022-03-08 NOTE — Progress Notes (Signed)
NEW ADMISSION NOTE ?New Admission Note:  ? ?Arrival Method:  Stretcher ?Mental Orientation:  A&O x 4 ?Telemetry:  Box 3 ?Assessment: Completed ?Skin: right upper are reddened, 0.5 / 0.5 stage two sacral pressure sore that was already charted from previous admission ?IV: Right upper are LR at 100 ?Pain: Denies ?Tubes: none present ?Safety Measures: Safety Fall Prevention Plan has been given, discussed and signed ?Admission: Completed ?[facility] Orientation: Patient has been orientated to the room, unit and staff.  ?Family: DIL arrived during admitting ? ?Orders have been reviewed and implemented. Will continue to monitor the patient. Call light has been placed within reach and bed alarm has been activated.  ? ?Berneta Levins, RN   ?

## 2022-03-09 ENCOUNTER — Other Ambulatory Visit (HOSPITAL_COMMUNITY): Payer: Self-pay

## 2022-03-09 DIAGNOSIS — L03113 Cellulitis of right upper limb: Secondary | ICD-10-CM | POA: Diagnosis not present

## 2022-03-09 LAB — BASIC METABOLIC PANEL
Anion gap: 7 (ref 5–15)
BUN: 6 mg/dL — ABNORMAL LOW (ref 8–23)
CO2: 26 mmol/L (ref 22–32)
Calcium: 7.3 mg/dL — ABNORMAL LOW (ref 8.9–10.3)
Chloride: 104 mmol/L (ref 98–111)
Creatinine, Ser: 0.63 mg/dL (ref 0.61–1.24)
GFR, Estimated: >60 mL/min (ref >60)
Glucose, Bld: 124 mg/dL — ABNORMAL HIGH (ref 70–99)
Potassium: 2.9 mmol/L — ABNORMAL LOW (ref 3.5–5.1)
Sodium: 137 mmol/L (ref 135–145)

## 2022-03-09 LAB — CBC WITH DIFFERENTIAL/PLATELET
Abs Immature Granulocytes: 0 10*3/uL (ref 0.00–0.07)
Basophils Absolute: 0 10*3/uL (ref 0.0–0.1)
Basophils Relative: 0 %
Eosinophils Absolute: 0 10*3/uL (ref 0.0–0.5)
Eosinophils Relative: 0 %
HCT: 31.4 % — ABNORMAL LOW (ref 39.0–52.0)
Hemoglobin: 10.1 g/dL — ABNORMAL LOW (ref 13.0–17.0)
Lymphocytes Relative: 1 %
Lymphs Abs: 0.2 10*3/uL — ABNORMAL LOW (ref 0.7–4.0)
MCH: 26.1 pg (ref 26.0–34.0)
MCHC: 32.2 g/dL (ref 30.0–36.0)
MCV: 81.1 fL (ref 80.0–100.0)
Monocytes Absolute: 0.5 10*3/uL (ref 0.1–1.0)
Monocytes Relative: 2 %
Neutro Abs: 23 10*3/uL — ABNORMAL HIGH (ref 1.7–7.7)
Neutrophils Relative %: 97 %
Platelets: 363 10*3/uL (ref 150–400)
RBC: 3.87 MIL/uL — ABNORMAL LOW (ref 4.22–5.81)
RDW: 23.2 % — ABNORMAL HIGH (ref 11.5–15.5)
WBC: 23.7 10*3/uL — ABNORMAL HIGH (ref 4.0–10.5)
nRBC: 0.4 % — ABNORMAL HIGH (ref 0.0–0.2)
nRBC: 3 /100 WBC — ABNORMAL HIGH

## 2022-03-09 LAB — GLUCOSE, CAPILLARY
Glucose-Capillary: 144 mg/dL — ABNORMAL HIGH (ref 70–99)
Glucose-Capillary: 126 mg/dL — ABNORMAL HIGH (ref 70–99)
Glucose-Capillary: 146 mg/dL — ABNORMAL HIGH (ref 70–99)
Glucose-Capillary: 125 mg/dL — ABNORMAL HIGH (ref 70–99)

## 2022-03-09 LAB — MAGNESIUM: Magnesium: 1.6 mg/dL — ABNORMAL LOW (ref 1.7–2.4)

## 2022-03-09 LAB — PHOSPHORUS: Phosphorus: 1.8 mg/dL — ABNORMAL LOW (ref 2.5–4.6)

## 2022-03-09 LAB — CALCIUM, IONIZED: Calcium, Ionized, Serum: 4.4 mg/dL — ABNORMAL LOW (ref 4.5–5.6)

## 2022-03-09 MED ORDER — RIVAROXABAN 10 MG PO TABS: 20.0000 mg | ORAL_TABLET | Freq: Every day | ORAL | Status: DC

## 2022-03-09 MED ORDER — RIVAROXABAN 15 MG PO TABS
15.0000 mg | ORAL_TABLET | Freq: Two times a day (BID) | ORAL | Status: DC
  Administered 2022-03-09 – 2022-03-11 (×5): 15 mg via ORAL
  Filled 2022-03-09 (×5): qty 1

## 2022-03-09 MED ORDER — ADULT MULTIVITAMIN W/MINERALS CH
1.0000 | ORAL_TABLET | Freq: Every day | ORAL | Status: DC
  Administered 2022-03-09 – 2022-03-11 (×3): 1 via ORAL
  Filled 2022-03-09 (×3): qty 1

## 2022-03-09 MED ORDER — POTASSIUM CHLORIDE CRYS ER 20 MEQ PO TBCR
40.0000 meq | EXTENDED_RELEASE_TABLET | ORAL | Status: AC
  Administered 2022-03-09 (×2): 40 meq via ORAL
  Filled 2022-03-09 (×2): qty 2

## 2022-03-09 NOTE — Progress Notes (Signed)
ANTICOAGULATION CONSULT NOTE  ? ?Pharmacy Consult for Xarelto ?Indication: RUE DVT ? ?No Known Allergies ? ?Patient Measurements: ?Height: '5\' 8"'$  (172.7 cm) ?Weight: 48.3 kg (106 lb 7.7 oz) ?IBW/kg (Calculated) : 68.4 ? ?Vital Signs: ?Temp: 98 ?F (36.7 ?C) (03/29 8850) ?Temp Source: Oral (03/29 0432) ?BP: 134/70 (03/29 0432) ?Pulse Rate: 84 (03/29 0432) ? ?Labs: ?Recent Labs  ?  03/07/22 ?1701 03/08/22 ?0623  ?HGB 12.0* 9.7*  ?HCT 37.6* 28.8*  ?PLT 444* 361  ?CREATININE 0.70 0.64  ? ? ?Estimated Creatinine Clearance: 57 mL/min (by C-G formula based on SCr of 0.64 mg/dL). ? ?Assessment: ? To begin Xarelto for RUE DVT per duplex 03/08/22. Noted hx R arm peripheral IV during recent admission.  ? No anticoagulation PTA.  SCDs ordered on admit 3/27. Hgb trended down yesterday, fluid boluses given. ? ?Goal of Therapy:  ?Appropriate Xarelto regimen for indication ?Monitor platelets by anticoagulation protocol: Yes ?  ?Plan:  ?Xarelto 15 mg BID x 21 days, then 20 mg daily with supper. ?Monitor for bleeding. ?CBC in am. ? ?Arty Baumgartner, RPh ?03/09/2022,8:33 AM ? ? ?

## 2022-03-09 NOTE — Progress Notes (Addendum)
Initial Nutrition Assessment ? ?DOCUMENTATION CODES:  ?Severe malnutrition in context of chronic illness, Underweight ? ?INTERVENTION:  ?-Monitor magnesium, potassium, and phosphorus BID for at least 3 days, MD to replete as needed, as pt is at risk for refeeding syndrome given severe malnutrition and poor PO intake.  ?-Ensure Enlive po TID, each supplement provides 350 kcal and 20 grams of protein. ?-MVI with minerals daily ?-recommend initiation of thiamine due to likelihood of refeeding  ?-continue regular diet ? ?Pt would benefit from placement of g-tube as it is highly unlikely he has the stamina to replenish his extreme loss of adipose and muscle stores through PO intake alone.  ? ?NUTRITION DIAGNOSIS:  ?Severe Malnutrition related to chronic illness (prostate cancer) as evidenced by severe muscle depletion, severe fat depletion, energy intake < or equal to 75% for > or equal to 1 month, percent weight loss. ? ?GOAL:  ?Patient will meet greater than or equal to 90% of their needs ? ?MONITOR:  ?PO intake, Supplement acceptance, Skin, Weight trends, Labs, I & O's ? ?REASON FOR ASSESSMENT:  ?Malnutrition Screening Tool ?  ? ?ASSESSMENT:  ?Pt with PMH significant for prostate Ca w/ metastasis to the bone, type 2 DM managed via lifestyle modifications, and h/o protein calorie malnutrition admitted with severe sepsis 2/2 RUE cellulitis. ? ?Pt was recently hospitalized at [facility] from 02/27/2022 to 03/04/2022 for constipation associated with opioid use in the setting of his history of prostate cancer with metastasis to the bone.  During this recent prior hospitalization, he reportedly had a peripheral IV in the right cubital fossa.  Otherwise, he denies any recent trauma to the right upper extremity.  additionally, he reportedly underwent chemotherapy during this most recent hospitalization, with most recent prior white cell count noted to be 1.5 on 03/04/2022.  ? ?Pt reports appetite has been poor, reports eating an  average of 1 meal per day (something like a sausage biscuit and occasional snacks). Pt aware of increased nutrient needs with chemo. Meal completions since admission have been 0-75% x 3 recorded meals (~33% avg meal intake). Pt already with orders for Ensure TID and is agreeable to continue these supplements.  ? ?Pt with severe fat and muscle depletions, prolonged inadequate PO intake, and clinically significant 16.1% weight loss over the last 6 months. Pt would benefit from placement of g-tube as it is highly unlikely he has the stamina to replenish his extreme loss of adipose and muscle stores through PO intake alone.  ?  ?Reviewed labs; electrolytes suggestive of refeeding syndrome given abnormalities with phosphorus and potassium. Phosphorus currently being repleted. Monitor magnesium, potassium, and phosphorus BID for at least 3 days, MD to replete as needed, as pt is at high risk for refeeding syndrome given poor po intake and severe malnutrition. Also recommend initiation of thiamine. Discussed with MD.  ? ?UOP: 863m x24 hours ?I/O: +[id number] admit ? ?Medications: ?Scheduled Meds: ? feeding supplement  237 mL Oral TID BM  ? insulin aspart  0-6 Units Subcutaneous TID WC  ? naloxegol oxalate  12.5 mg Oral Daily  ? phosphorus  250 mg Oral Daily  ? senna  1 tablet Oral BID  ?Continuous Infusions: ? ceFEPime (MAXIPIME) IV Stopped (03/09/22 0127)  ? lactated ringers 100 mL/hr at 03/08/22 1804  ? vancomycin 750 mg (03/09/22 1109)  ? ?Labs: ?Recent Labs  ?Lab 03/03/22 ?0142 03/04/22 ?0138 03/07/22 ?1701 03/08/22 ?0654653/29/23 ?[id number]?NA 135 138 138 140 137  ?K 4.4 3.8 3.6 3.3*  2.9*  ?CL 109 109 104 106 104  ?CO2 21* '24 22 29 26  '$ ?BUN 8 6* 10 8 6*  ?CREATININE 0.54* 0.58* 0.70 0.64 0.63  ?CALCIUM 7.5* 7.4* 7.8* 7.3* 7.3*  ?MG 2.1 1.9  --  1.7  --   ?PHOS 1.8* 1.7*  --  1.8*  --   ?GLUCOSE 79 76 72 72 124*  ?CBGs 66-158 x 24 hours ? ?NUTRITION - FOCUSED PHYSICAL EXAM: ?Flowsheet Row Most Recent Value  ?Orbital  Region Severe depletion  ?Upper Arm Region Severe depletion  ?Thoracic and Lumbar Region Severe depletion  ?Buccal Region Severe depletion  ?Temple Region Severe depletion  ?Clavicle Bone Region Severe depletion  ?Clavicle and Acromion Bone Region Severe depletion  ?Scapular Bone Region Severe depletion  ?Dorsal Hand Severe depletion  ?Patellar Region Severe depletion  ?Anterior Thigh Region Severe depletion  ?Posterior Calf Region Severe depletion  ?Edema (RD Assessment) None  ?Hair Reviewed  ?Eyes Reviewed  ?Mouth Reviewed  ?Skin Reviewed  ?Nails Reviewed  ? ?Diet Order:   ?Diet Order   ? ?       ?  Diet regular Room service appropriate? Yes; Fluid consistency: Thin  Diet effective now       ?  ? ?  ?  ? ?  ? ?EDUCATION NEEDS:  ?No education needs have been identified at this time ? ?Skin:  Skin Assessment: Skin Integrity Issues: ?Skin Integrity Issues:: Stage II ?Stage II: sacrum ? ?Last BM:  3/29 ? ?Height:  ?Ht Readings from Last 1 Encounters:  ?03/08/22 '5\' 8"'$  (1.727 m)  ? ?Weight:  ?Wt Readings from Last 3 Encounters:  ?03/08/22 48.3 kg  ?03/03/22 45.1 kg  ?08/24/21 57.6 kg  ? ?BMI:  Body mass index is 16.19 kg/m?. ? ?Estimated Nutritional Needs:  ?Kcal:  1600-1800 ?Protein:  80-90 grams ?Fluid:  >1.8L ? ? ?Theone Stanley., MS, RD, LDN (she/her/hers) ?RD pager number and weekend/on-call pager number located in [place]. ?

## 2022-03-09 NOTE — Progress Notes (Signed)
?Progress note ? ? ?George Chavez UUV:[id number] DOB: 1951-03-21 DOA: 03/07/2022 ? ?PCP: [facility]  ?Patient coming from: home  ? ?Chief Complaint: Right upper extremity erythema ? ?HPI: George Chavez is a 71 y.o. male with medical history significant for prostate cancer with metastasis to the bone, type 2 diabetes mellitus managed via lifestyle modifications, protein calorie malnutrition, who is admitted to [facility] on 03/07/2022 with severe sepsis due to right upper extremity cellulitis after presenting from home to [facility] ED complaining of  right upper extremity erythema.  ? ?The following history is provided by the patient as well as my discussions with the EDP, and via chart review. ? ?The patient reports 2 days of new onset right upper extremity erythema associated with swelling, tenderness, increased warmth to the touch, but denies any associated drainage, including no purulent drainage.  Also denies any associated crepitus, numbness, or paresthesias.  Denies erythema in any other location.  Reports that right upper extremity erythema is around the cubital fossa, with distal extension of erythema/swelling terminating proximal to the right wrist.  Lower extremity erythema, edema, or acute calf tenderness.  Denies any proximal extension of the right cubital fossa erythema/swelling, also denies any neck swelling. denies any associated subjective fever, chills, rigors, or generalized myalgias.  No recent chest pain, shortness of breath, palpitations, diaphoresis, nausea, vomiting, dizziness, presyncope, syncope.  No cough, new onset abdominal pain, or diarrhea.  He also denies any recent dysuria, gross hematuria, or change in urinary urgency/frequency.  No headache or neck stiffness. ? ?The patient was recently hospitalized at [facility] from 02/27/2022 to 03/04/2022 for constipation associated with opioid use in the setting of his history of prostate cancer with metastasis to the bone.   During this recent prior hospitalization, he reportedly had a peripheral IV in the right cubital fossa.  Otherwise, he denies any recent trauma to the right upper extremity.  additionally, he reportedly underwent chemotherapy during this most recent hospitalization, with most recent prior will cell count noted to be 1.5 on 03/04/2022.  No known history of any prior DVT/PE. ? ? ?ED Course:  ?Vital signs in the ED were notable for the following: Afebrile; heart rate 73-92; initial blood pressure 93/62, with subsequent increase to 120/75 following interval IV fluid bolus, as further detailed below; respiratory rate 16, oxygen saturation 98 to 100% on room air. ? ?Labs were notable for the following: CMP notable for the following: Bicarbonate 22, creatinine 0.70 compared to most recent prior value 0.58 on 03/04/2022, glucose 72, calcium adjusted for mild hypoalbuminemia 8.6, albumin 2.9, alkaline phosphatase 197 otherwise liver enzymes within normal limits.  CBC notable for will blood cell count 44,000 with 84% neutrophils, hemoglobin 12, platelet count 444.  Initial lactate 2.4, with repeat trending down to 2.3.  ESR/CRP ordered in the ED, with results currently pending.  Blood cultures x2 collected prior to initiation of IV antibiotics.  ? ?Imaging and additional notable ED work-up: Plain films of the right elbow showed no evidence of acute fracture or dislocation, no evidence of significant arthritic changes, no evidence of right elbow joint effusion, nor any evidence of subcutaneous gas. ? ?While in the ED, the following were administered: Morphine 4 mg IV x1, cefepime, IV vancomycin, lactated Ringer's x1.5 L bolus followed by continuous LR at 100 cc/h. ? ?Subsequently, the patient was admitted for further evaluation management of severe sepsis due to right upper extremity cellulitis.  ? ? ?Objective  ? ? ?Physical Exam: ?  Vitals:  ? 03/08/22 1800 03/08/22 2113 03/09/22 0122 03/09/22 0432  ?BP:  118/70 126/75 134/70   ?Pulse:  72 76 84  ?Resp:  _0 ?Temp:  98 ?F (36.7 ?C) 97.9 ?F (36.6 ?C) 98 ?F (36.7 ?C)  ?TempSrc:    Oral  ?SpO2:  98% 99% 100%  ?Weight:      ?Height: _1  (1.727 m)     ? ? ?General: appears to be stated age; alert, oriented ?Skin: warm, dry, no rash ?Head:  AT/[place] ?Mouth:  Oral mucosa membranes appear dry, normal dentition ?Neck: supple; trachea midline ?Heart:  RRR; did not appreciate any M/R/G ?Lungs: CTAB, did not appreciate any wheezes, rales, or rhonchi ?Abdomen: + BS; soft, ND, NT ?Vascular: 2+ pedal pulses b/l; 2+ radial pulses b/l ?Extremities: Right upper extremity erythema starting at the right cubital fossa and extending distally, with termination distal to the right wrist and associated with swelling, tenderness, increased warmth to touch no peripheral edema, no muscle wasting; ROM of b/l elbows preserved. ?Neuro: strength and sensation intact in upper and lower extremities b/l ? ? ?Labs on Admission: I have personally reviewed following labs and imaging studies ? ?CBC: ?Recent Labs  ?Lab 03/03/22 ?0142 03/04/22 ?0138 03/07/22 ?1701 03/08/22 ?2952 03/09/22 ?8413  ?WBC 3.1* 1.5* 44.2* 30.4* 23.7*  ?NEUTROABS  --   --  37.4* 25.4* 23.0*  ?HGB 8.6* 9.2* 12.0* 9.7* 10.1*  ?HCT 26.6* 28.1* 37.6* 28.8* 31.4*  ?MCV 80.1 80.7 82.3 79.6* 81.1  ?PLT 229 240 444* 361 363  ? ?Basic Metabolic Panel: ?Recent Labs  ?Lab 03/03/22 ?0142 03/04/22 ?0138 03/07/22 ?1701 03/08/22 ?2440 03/09/22 ?1027  ?NA 135 138 138 140 137  ?K 4.4 3.8 3.6 3.3* 2.9*  ?CL 109 109 104 106 104  ?CO2 21* _2 ?GLUCOSE 79 76 72 72 124*  ?BUN 8 6* 10 8 6*  ?CREATININE 0.54* 0.58* 0.70 0.64 0.63  ?CALCIUM 7.5* 7.4* 7.8* 7.3* 7.3*  ?MG 2.1 1.9  --  1.7  --   ?PHOS 1.8* 1.7*  --  1.8*  --   ? ?GFR: ?Estimated Creatinine Clearance: 57 mL/min (by C-G formula based on SCr of 0.63 mg/dL). ?Liver Function Tests: ?Recent Labs  ?Lab 03/07/22 ?1701 03/08/22 ?0623  ?AST 24 17  ?ALT 12 10  ?ALKPHOS 197* 125  ?BILITOT 0.7 0.7  ?PROT 5.6*  4.2*  ?ALBUMIN 2.9* 2.2*  ? ?No results for input(s): LIPASE, AMYLASE in the last 168 hours. ?No results for input(s): AMMONIA in the last 168 hours. ?Coagulation Profile: ?No results for input(s): INR, PROTIME in the last 168 hours. ?Cardiac Enzymes: ?No results for input(s): CKTOTAL, CKMB, CKMBINDEX, TROPONINI in the last 168 hours. ?BNP (last 3 results) ?No results for input(s): PROBNP in the last 8760 hours. ?HbA1C: ?No results for input(s): HGBA1C in the last 72 hours. ?CBG: ?Recent Labs  ?Lab 03/08/22 ?1133 03/08/22 ?1721 03/08/22 ?2112 03/09/22 ?0725 03/09/22 ?1141  ?GLUCAP 112* 158* 116* 144* 126*  ? ?Lipid Profile: ?No results for input(s): CHOL, HDL, LDLCALC, TRIG, CHOLHDL, LDLDIRECT in the last 72 hours. ?Thyroid Function Tests: ?No results for input(s): TSH, T4TOTAL, FREET4, T3FREE, THYROIDAB in the last 72 hours. ?Anemia Panel: ?No results for input(s): VITAMINB12, FOLATE, FERRITIN, TIBC, IRON, RETICCTPCT in the last 72 hours. ?Urine analysis: ?   ?Component Value Date/Time  ? COLORURINE YELLOW 03/08/2022 2536  ? APPEARANCEUR CLEAR 03/08/2022 6440  ? LABSPEC 1.015 03/08/2022 0822  ? PHURINE 5.0 03/08/2022 0822  ?  GLUCOSEU NEGATIVE 03/08/2022 0822  ? [place] NEGATIVE 03/08/2022 0822  ? [place] NEGATIVE 03/08/2022 7519  ? [place] NEGATIVE 03/08/2022 8242  ? [place] NEGATIVE 03/08/2022 9980  ? NITRITE NEGATIVE 03/08/2022 6999  ? LEUKOCYTESUR NEGATIVE 03/08/2022 6722  ? ? ?Radiological Exams on Admission: ?DG Elbow Complete Right ? ?Result Date: 03/07/2022 ?CLINICAL DATA:  Right elbow pain and swelling. EXAM: RIGHT ELBOW - COMPLETE 3+ VIEW COMPARISON:  None. FINDINGS: There is no acute fracture or dislocation. The bones are well mineralized. No significant arthritic changes. No joint effusion. The soft tissues are unremarkable. IMPRESSION: Negative. Electronically Signed   By: Anner Crete M.D.   On: 03/07/2022 19:41  ? ?DG CHEST PORT 1 VIEW ? ?Result Date: 03/08/2022 ?CLINICAL DATA:  71 year old  male with sepsis. EXAM: PORTABLE CHEST 1 VIEW COMPARISON:  Portable chest 02/27/2022 and earlier. FINDINGS: Portable AP semi upright view at 0514 hours. Lung volumes and mediastinal contours remain normal. Visu

## 2022-03-09 NOTE — TOC Benefit Eligibility Note (Signed)
Patient Advocate Encounter ? ?Insurance verification completed.   ? ?The patient is currently admitted and upon discharge could be taking Xarelto Starter Pack. ? ?The current 30 day co-pay is, $45.00.  ? ?The patient is currently admitted and upon discharge could be taking Xarelto 20 mg. ? ?The current 30 day co-pay is, $45.00.  ? ?The patient is insured through [employer] Part D  ? ? ? ?Lyndel Safe, CPhT ?Pharmacy Patient Advocate Specialist ?[place] Patient Advocate Team ?Direct Number: [phone]  Fax: [phone] ? ? ? ? ? ?  ?

## 2022-03-09 NOTE — TOC Initial Note (Addendum)
Transition of Care (TOC) - Initial/Assessment Note  ? ? ?Patient Details  ?Name: George Chavez ?MRN: [id number] ?Date of Birth: August 12, 1951 ? ?Transition of Care (TOC) CM/SW Contact:    ?Tom-Johnson, Renea Ee, RN ?Phone Number: ?03/09/2022, 3:23 PM ? ?Clinical Narrative:                 ? ?CM spoke with patient about needs for post hospital transition. Admitted for RUE Cellulitis. From home with grand daughter and daughter in-law. Lamonte Sakai one living son and he checks on patient once a week. Patient has two living brothers and they are supportive with care. Does not drive, family transports to and from appointments and does his errands. Has a cane, walker and shower seat at home.  ?PCP is at [facility] in [place]. Uses [facility] pharmacy on [address] in [place]. ?Active with [facility] for PT/OT/RN services. Marjory Lies notified of patient's admission.  ?Awaiting PT/OT eval for disposition. CM will continue to follow with needs. ? ?Expected Discharge Plan: [place] ?Barriers to Discharge: Continued Medical Work up ? ? ?Patient Goals and CMS Choice ?Patient states their goals for this hospitalization and ongoing recovery are:: To return home ?CMS Medicare.gov Compare Post Acute Care list provided to:: Patient ?Choice offered to / list presented to : Patient ? ?Expected Discharge Plan and Services ?Expected Discharge Plan: [place] ?  ?Discharge Planning Services: CM Consult ?Post Acute Care Choice: Home Health ?Living arrangements for the past 2 months: [place] ?                ?  ?  ?  ?  ?  ?HH Arranged: PT, OT ?[place] Agency: [place] ?Date HH Agency Contacted: 03/09/22 ?Time [place]: 1520 ?Representative spoke with at [place]: Marjory Lies ? ?Prior Living Arrangements/Services ?Living arrangements for the past 2 months: [place] ?Lives with:: Adult Children ([place] daughter and daughter in-law) ?Patient language and need for interpreter  reviewed:: Yes ?Do you feel safe going back to the place where you live?: Yes      ?Need for Family Participation in Patient Care: Yes (Comment) ?Care giver support system in place?: Yes (comment) ?Current home services: DME (Cane, walker, shower chair.) ?Criminal Activity/Legal Involvement Pertinent to Current Situation/Hospitalization: No - Comment as needed ? ?Activities of Daily Living ?Home Assistive Devices/Equipment: Gilford Rile (specify type), Cane (specify quad or straight) ?ADL Screening (condition at time of admission) ?Patient's cognitive ability adequate to safely complete daily activities?: Yes ?Is the patient deaf or have difficulty hearing?: No ?Does the patient have difficulty seeing, even when wearing glasses/contacts?: No ?Does the patient have difficulty concentrating, remembering, or making decisions?: Yes ?Patient able to express need for assistance with ADLs?: Yes ?Does the patient have difficulty dressing or bathing?: Yes ?Independently performs ADLs?: No ?Communication: Independent ?Dressing (OT): Needs assistance ?Is this a change from baseline?: Pre-admission baseline ?Grooming: Needs assistance ?Is this a change from baseline?: Pre-admission baseline ?Feeding: Needs assistance ?Is this a change from baseline?: Pre-admission baseline ?Bathing: Needs assistance ?Is this a change from baseline?: Change from baseline, expected to last <3 days ?Toileting: Needs assistance ?Is this a change from baseline?: Pre-admission baseline ?In/Out Bed: Needs assistance ?Is this a change from baseline?: Pre-admission baseline ?Walks in Home: Needs assistance ?Is this a change from baseline?: Pre-admission baseline ?Does the patient have difficulty walking or climbing stairs?: Yes ?Weakness of Legs: Both ?Weakness of Arms/Hands: Both ? ?Permission Sought/Granted ?  Permission sought to share information with : Case Manager, Customer service manager, Family Supports ?Permission granted to share information  with : Yes, Verbal Permission Granted ? Share Information with NAME: Murry ? Permission granted to share info w AGENCY: [place] ?   ?   ? ?Emotional Assessment ?Appearance:: Appears stated age ?Attitude/Demeanor/Rapport: Engaged, Gracious ?Affect (typically observed): Accepting, Appropriate, Calm, Hopeful ?Orientation: : Oriented to Self, Oriented to Place, Oriented to Situation ?Alcohol / Substance Use: Not Applicable ?Psych Involvement: No (comment) ? ?Admission diagnosis:  Lactic acidosis [E87.20] ?Cellulitis of right upper extremity [L03.113] ?Leukocytosis, unspecified type [D72.829] ?Patient Active Problem List  ? Diagnosis Date Noted  ? Cellulitis of right upper extremity 03/07/2022  ? Severe sepsis ([place]) 03/07/2022  ? DM2 (diabetes mellitus, type 2) ([place])   ? Hypocalcemia   ? Protein-calorie malnutrition, severe 03/02/2022  ? Pressure injury of skin 03/02/2022  ? Abdominal pain 02/27/2022  ? Constipation due to opioid therapy 02/27/2022  ? Prostate cancer metastatic to bone [facility])   ? Cancer associated pain   ? Leukocytosis   ? Hypotension   ? ?PCP:  [facility] ?Pharmacy:   ?[facility] [place] ?[place] ?[place] [address] ?Phone: [phone] Fax: [phone] ? ?[id number] [address] ?[place] ?[place] [address] ?Phone: [phone] Fax: [phone] ? ? ? ? ?Social Determinants of Health (SDOH) Interventions ?  ? ?Readmission Risk Interventions ?   ? View : No data to display.  ?  ?  ?  ? ? ? ?

## 2022-03-09 NOTE — Evaluation (Signed)
Physical Therapy Evaluation ?Patient Details ?Name: George Chavez ?MRN: [id number] ?DOB: [birth year] ?Today's Date: 03/09/2022 ? ?History of Present Illness ? Pt is a 71 y.o. M who presents 03/07/2022 with severe sepsis due to right upper extremity cellulitis and DVT. 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  ?Clinical Impression ? Pt admitted with above. Pleasant and agreeable to participate. Pt requiring min assist for bed mobility/transfers and ambulating 100 ft with a walker at a min guard assist level. Demonstrates generalized weakness, RUE pain, balance deficits, and decreased activity tolerance. Encouraged continued RUE elevation. Recommend HHPT to address deficits. ?   ? ?Recommendations for follow up therapy are one component of a multi-disciplinary discharge planning process, led by the attending physician.  Recommendations may be updated based on patient status, additional functional criteria and insurance authorization. ? ?Follow Up Recommendations Home health PT ? ?  ?Assistance Recommended at Discharge Frequent or constant Supervision/Assistance  ?Patient can return home with the following ? A little help with walking and/or transfers;A little help with bathing/dressing/bathroom;Help with stairs or ramp for entrance ? ?  ?Equipment Recommendations None recommended by PT  ?Recommendations for Other Services ?    ?  ?Functional Status Assessment Patient has had a recent decline in their functional status and demonstrates the ability to make significant improvements in function in a reasonable and predictable amount of time.  ? ?  ?Precautions / Restrictions Precautions ?Precautions: Fall ?Restrictions ?Weight Bearing Restrictions: No  ? ?  ? ?Mobility ? Bed Mobility ?Overal bed mobility: Needs Assistance ?Bed Mobility: Supine to Sit, Sit to Supine ?  ?  ?Supine to sit: Min assist ?Sit to supine: Supervision ?  ?General bed mobility comments: MinA to pull trunk  to upright, no physical assist for return to bed ?  ? ?Transfers ?Overall transfer level: Needs assistance ?Equipment used: Rolling walker (2 wheels) ?Transfers: Sit to/from Stand ?Sit to Stand: Min assist ?  ?  ?  ?  ?  ?General transfer comment: MinA to rise from edge of bed ?  ? ?Ambulation/Gait ?Ambulation/Gait assistance: Min guard ?Gait Distance (Feet): 100 Feet ?Assistive device: Rolling walker (2 wheels) ?Gait Pattern/deviations: Decreased stride length, Step-to pattern, Scissoring, Narrow base of support ?Gait velocity: decreased ?Gait velocity interpretation: <1.8 ft/sec, indicate of risk for recurrent falls ?  ?General Gait Details: Pt reports being "pigeon toed," at baseline; noted mild scissoring, narrow  BOS. Min guard for safety. ? ?Stairs ?  ?  ?  ?  ?  ? ?Wheelchair Mobility ?  ? ?Modified Rankin (Stroke Patients Only) ?  ? ?  ? ?Balance Overall balance assessment: Needs assistance ?Sitting-balance support: No upper extremity supported ?Sitting balance-Leahy Scale: Good ?  ?  ?Standing balance support: Bilateral upper extremity supported, Reliant on assistive device for balance ?Standing balance-Leahy Scale: Poor ?Standing balance comment: RW and min guard ?  ?  ?  ?  ?  ?  ?  ?  ?  ?  ?  ?   ? ? ? ?Pertinent Vitals/Pain Pain Assessment ?Pain Assessment: Faces ?Faces Pain Scale: Hurts little more ?Pain Location: RUE ?Pain Descriptors / Indicators: Discomfort, Tightness, Guarding ?Pain Intervention(s): Monitored during session  ? ? ?Home Living Family/patient expects to be discharged to:: Private residence ?Living Arrangements: Children;Other relatives ?Available Help at Discharge: Family;Available PRN/intermittently ?Type of Home: House ?Home Access: Stairs to enter ?Entrance Stairs-Rails: Right;Left ?Entrance Stairs-Number of Steps: 4 ?  ?Home Layout: One level ?Home Equipment: Conservation officer, nature (  2 wheels);BSC/3in1;Shower seat ?   ?  ?Prior Function Prior Level of Function : Needs assist ?  ?  ?  ?  ?   ?  ?Mobility Comments: Reports easily fatigued and only walks short distances in house; is able to get out to car with assist from family and neighbors; uses RW; 2 falls in last 6 months ?ADLs Comments: Reports independent but modified and fatigues easily; only does sponge baths; uses BSC if family not there ?  ? ? ?Hand Dominance  ?   ? ?  ?Extremity/Trunk Assessment  ? Upper Extremity Assessment ?Upper Extremity Assessment: Defer to OT evaluation ?  ? ?Lower Extremity Assessment ?Lower Extremity Assessment: Generalized weakness ?  ? ?Cervical / Trunk Assessment ?Cervical / Trunk Assessment: Kyphotic  ?Communication  ? Communication: No difficulties  ?Cognition Arousal/Alertness: Awake/alert ?Behavior During Therapy: [facility] for tasks assessed/performed ?Overall Cognitive Status: Within Functional Limits for tasks assessed ?  ?  ?  ?  ?  ?  ?  ?  ?  ?  ?  ?  ?  ?  ?  ?  ?  ?  ?  ? ?  ?General Comments   ? ?  ?Exercises    ? ?Assessment/Plan  ?  ?PT Assessment Patient needs continued PT services  ?PT Problem List Decreased strength;Decreased range of motion;Decreased activity tolerance;Decreased balance;Decreased mobility;Decreased knowledge of use of DME ? ?   ?  ?PT Treatment Interventions DME instruction;Therapeutic activities;Gait training;Therapeutic exercise;Patient/family education;Stair training;Balance training;Functional mobility training   ? ?PT Goals (Current goals can be found in the Care Plan section)  ?Acute Rehab PT Goals ?Patient Stated Goal: return home ?PT Goal Formulation: With patient ?Time For Goal Achievement: 03/23/22 ?Potential to Achieve Goals: Fair ? ?  ?Frequency Min 3X/week ?  ? ? ?Co-evaluation   ?  ?  ?  ?  ? ? ?  ?AM-PAC PT "6 Clicks" Mobility  ?Outcome Measure Help needed turning from your back to your side while in a flat bed without using bedrails?: A Little ?Help needed moving from lying on your back to sitting on the side of a flat bed without using bedrails?: A Little ?Help needed  moving to and from a bed to a chair (including a wheelchair)?: A Little ?Help needed standing up from a chair using your arms (e.g., wheelchair or bedside chair)?: A Little ?Help needed to walk in hospital room?: A Little ?Help needed climbing 3-5 steps with a railing? : A Lot ?6 Click Score: 17 ? ?  ?End of Session Equipment Utilized During Treatment: Gait belt ?Activity Tolerance: Patient tolerated treatment well ?Patient left: in bed;with call bell/phone within reach;with bed alarm set ?Nurse Communication: Mobility status ?PT Visit Diagnosis: Other abnormalities of gait and mobility (R26.89);Muscle weakness (generalized) (M62.81) ?  ? ?Time: 7017-7939 ?PT Time Calculation (min) (ACUTE ONLY): 19 min ? ? ?Charges:   PT Evaluation ?$PT Eval Moderate Complexity: 1 Mod ?  ?  ?   ? ? ?Wyona Almas, PT, DPT ?Acute Rehabilitation Services ?Pager [phone] ?Office [phone] ? ? ?Deno Etienne ?03/09/2022, 4:52 PM ? ?

## 2022-03-10 DIAGNOSIS — L03113 Cellulitis of right upper limb: Secondary | ICD-10-CM | POA: Diagnosis not present

## 2022-03-10 LAB — CBC WITH DIFFERENTIAL/PLATELET
Abs Immature Granulocytes: 0.7 10*3/uL — ABNORMAL HIGH (ref 0.00–0.07)
Basophils Absolute: 0 10*3/uL (ref 0.0–0.1)
Basophils Relative: 0 %
Eosinophils Absolute: 0.2 10*3/uL (ref 0.0–0.5)
Eosinophils Relative: 1 %
HCT: 26 % — ABNORMAL LOW (ref 39.0–52.0)
Hemoglobin: 8.9 g/dL — ABNORMAL LOW (ref 13.0–17.0)
Lymphocytes Relative: 3 %
Lymphs Abs: 0.7 10*3/uL (ref 0.7–4.0)
MCH: 27 pg (ref 26.0–34.0)
MCHC: 34.2 g/dL (ref 30.0–36.0)
MCV: 78.8 fL — ABNORMAL LOW (ref 80.0–100.0)
Metamyelocytes Relative: 1 %
Monocytes Absolute: 0.7 10*3/uL (ref 0.1–1.0)
Monocytes Relative: 3 %
Myelocytes: 2 %
Neutro Abs: 21.2 10*3/uL — ABNORMAL HIGH (ref 1.7–7.7)
Neutrophils Relative %: 90 %
Platelets: 298 10*3/uL (ref 150–400)
RBC: 3.3 MIL/uL — ABNORMAL LOW (ref 4.22–5.81)
RDW: 23.2 % — ABNORMAL HIGH (ref 11.5–15.5)
WBC: 23.6 10*3/uL — ABNORMAL HIGH (ref 4.0–10.5)
nRBC: 0 /100 WBC
nRBC: 0.3 % — ABNORMAL HIGH (ref 0.0–0.2)

## 2022-03-10 LAB — BASIC METABOLIC PANEL
Anion gap: 6 (ref 5–15)
BUN: 6 mg/dL — ABNORMAL LOW (ref 8–23)
CO2: 28 mmol/L (ref 22–32)
Calcium: 6.9 mg/dL — ABNORMAL LOW (ref 8.9–10.3)
Chloride: 107 mmol/L (ref 98–111)
Creatinine, Ser: 0.56 mg/dL — ABNORMAL LOW (ref 0.61–1.24)
GFR, Estimated: >60 mL/min (ref >60)
Glucose, Bld: 84 mg/dL (ref 70–99)
Potassium: 3.8 mmol/L (ref 3.5–5.1)
Sodium: 141 mmol/L (ref 135–145)

## 2022-03-10 LAB — MAGNESIUM
Magnesium: 1.6 mg/dL — ABNORMAL LOW (ref 1.7–2.4)
Magnesium: 2 mg/dL (ref 1.7–2.4)

## 2022-03-10 LAB — PHOSPHORUS
Phosphorus: 1.5 mg/dL — ABNORMAL LOW (ref 2.5–4.6)
Phosphorus: 3 mg/dL (ref 2.5–4.6)

## 2022-03-10 LAB — GLUCOSE, CAPILLARY
Glucose-Capillary: 88 mg/dL (ref 70–99)
Glucose-Capillary: 103 mg/dL — ABNORMAL HIGH (ref 70–99)
Glucose-Capillary: 164 mg/dL — ABNORMAL HIGH (ref 70–99)
Glucose-Capillary: 116 mg/dL — ABNORMAL HIGH (ref 70–99)

## 2022-03-10 MED ORDER — ACETAMINOPHEN 325 MG PO TABS: 650.0000 mg | ORAL_TABLET | Freq: Four times a day (QID) | ORAL | Status: AC | PRN

## 2022-03-10 MED ORDER — LEVOFLOXACIN 500 MG PO TABS: 500.0000 mg | ORAL_TABLET | Freq: Every day | ORAL | 0 refills | Status: DC

## 2022-03-10 MED ORDER — RIVAROXABAN 20 MG PO TABS: 20.0000 mg | ORAL_TABLET | Freq: Every day | ORAL | 0 refills | Status: DC

## 2022-03-10 MED ORDER — DEXTROSE 5 % IV SOLN
30.0000 mmol | Freq: Once | INTRAVENOUS | Status: AC
  Administered 2022-03-10: 30 mmol via INTRAVENOUS
  Filled 2022-03-10: qty 10

## 2022-03-10 MED ORDER — RIVAROXABAN 15 MG PO TABS: 15.0000 mg | ORAL_TABLET | Freq: Two times a day (BID) | ORAL | 0 refills | Status: DC

## 2022-03-10 MED ORDER — RIVAROXABAN 15 MG PO TABS
15.0000 mg | ORAL_TABLET | Freq: Two times a day (BID) | ORAL | 0 refills | Status: DC
Start: 2022-03-10 — End: 2022-03-10

## 2022-03-10 MED ORDER — MAGNESIUM SULFATE 2 GM/50ML IV SOLN
2.0000 g | Freq: Once | INTRAVENOUS | Status: AC
  Administered 2022-03-10: 2 g via INTRAVENOUS
  Filled 2022-03-10: qty 50

## 2022-03-10 NOTE — Discharge Instructions (Signed)
Information on my medicine - XARELTO? (rivaroxaban) ? ?This medication education was reviewed with me or my healthcare representative as part of my discharge preparation.   ? ?WHY WAS XARELTO? PRESCRIBED FOR YOU? ?Xarelto? was prescribed to treat blood clots that may have been found in the veins of your right upper arm (deep vein thrombosis) and to reduce the risk of them occurring again. ? ?What do you need to know about Xarelto?? ?The starting dose is one 15 mg tablet taken TWICE daily with food for the FIRST 21 DAYS then on (enter date)  03/30/22  the dose is changed to one 20 mg tablet taken ONCE A DAY with your evening meal. ? ?DO NOT stop taking Xarelto? without talking to the health care provider who prescribed the medication.  Refill your prescription for 20 mg tablets before you run out. ? ?After discharge, you should have regular check-up appointments with your healthcare provider that is prescribing your Xarelto?Marland Kitchen  In the future your dose may need to be changed if your kidney function changes by a significant amount. ? ?What do you do if you miss a dose? ?If you are taking Xarelto? TWICE DAILY and you miss a dose, take it as soon as you remember. You may take two 15 mg tablets (total 30 mg) at the same time then resume your regularly scheduled 15 mg twice daily the next day. ? ?If you are taking Xarelto? ONCE DAILY and you miss a dose, take it as soon as you remember on the same day then continue your regularly scheduled once daily regimen the next day. Do not take two doses of Xarelto? at the same time.  ? ?Important Safety Information ?Xarelto? is a blood thinner medicine that can cause bleeding. You should call your healthcare provider right away if you experience any of the following: ?Bleeding from an injury or your nose that does not stop. ?Unusual colored urine (red or dark brown) or unusual colored stools (red or black). ?Unusual bruising for unknown reasons. ?A serious fall or if you hit your head  (even if there is no bleeding). ? ?Some medicines may interact with Xarelto? and might increase your risk of bleeding while on Xarelto?Marland Kitchen To help avoid this, consult your healthcare provider or pharmacist prior to using any new prescription or non-prescription medications, including herbals, vitamins, non-steroidal anti-inflammatory drugs (NSAIDs) and supplements. ? ?This website has more information on Xarelto?: [email or web address]  ?

## 2022-03-10 NOTE — TOC Transition Note (Addendum)
Transition of Care (TOC) - CM/SW Discharge Note ? ? ?Patient Details  ?Name: George Chavez ?MRN: [id number] ?Date of Birth: June 26, 1951 ? ?Transition of Care (TOC) CM/SW Contact:  ?Tom-Johnson, Renea Ee, RN ?Phone Number: ?03/10/2022, 12:24 PM ? ? ?Clinical Narrative:    ? ?Patient is scheduled for discharge today. Home health Resumes PT/RN/Aide disciplines with [facility]. Family to transport at discharge. Denies any other needs. No further TOC needs noted. ? ?Final next level of care: [place] ?Barriers to Discharge: Barriers Resolved ? ? ?Patient Goals and CMS Choice ?Patient states their goals for this hospitalization and ongoing recovery are:: To return home ?CMS Medicare.gov Compare Post Acute Care list provided to:: Patient ?Choice offered to / list presented to : Patient ? ?Discharge Placement ?  ?           ?  ?Patient to be transferred to facility by: Family ?  ?  ? ?Discharge Plan and Services ?  ?Discharge Planning Services: CM Consult ?Post Acute Care Choice: Home Health          ?  ?  ?  ?  ?  ?HH Arranged: Therapist, sports, Nurse's Aide ?[place] Agency: [place] ?Date HH Agency Contacted: 03/09/22 ?Time [place]: 1520 ?Representative spoke with at [place]: Marjory Lies ? ?Social Determinants of Health (SDOH) Interventions ?  ? ? ?Readmission Risk Interventions ? ?  03/10/2022  ? 12:23 PM  ?Readmission Risk Prevention Plan  ?Transportation Screening Complete  ?PCP or Specialist Appt within 3-5 Days Complete  ?[place] or Home Care Consult Complete  ?Social Work Consult for [place] Planning/Counseling Complete  ?Palliative Care Screening Not Applicable  ?Medication Review Press photographer) Complete  ? ? ? ? ? ?

## 2022-03-10 NOTE — TOC Progression Note (Signed)
Transition of Care (TOC) - Progression Note  ? ? ?Patient Details  ?Name: George Chavez ?MRN: [id number] ?Date of Birth: [birth year] ? ?Transition of Care (TOC) CM/SW Contact  ?Tom-Johnson, Renea Ee, RN ?Phone Number: ?03/10/2022, 2:39 PM ? ?Clinical Narrative:    ? ?CM notified by patient's nurse that patient is requesting to stay another day as he has no one at home to assist him today. States his grand daughter and daughter in-law are out of town.MD notified via secure chat and stated Ok. Avoidable days done by CM. CM will continue to follow with needs.  ? ? ?Expected Discharge Plan: [place] ?Barriers to Discharge: Barriers Resolved ? ?Expected Discharge Plan and Services ?Expected Discharge Plan: [place] ?  ?Discharge Planning Services: CM Consult ?Post Acute Care Choice: Home Health ?Living arrangements for the past 2 months: [place] ?Expected Discharge Date: 03/10/22               ?  ?  ?  ?  ?  ?HH Arranged: Therapist, sports, Nurse's Aide ?[place] Agency: [place] ?Date HH Agency Contacted: 03/09/22 ?Time [place]: 1520 ?Representative spoke with at [place]: Marjory Lies ? ? ?Social Determinants of Health (SDOH) Interventions ?  ? ?Readmission Risk Interventions ? ?  03/10/2022  ? 12:23 PM  ?Readmission Risk Prevention Plan  ?Transportation Screening Complete  ?PCP or Specialist Appt within 3-5 Days Complete  ?[place] or Home Care Consult Complete  ?Social Work Consult for [place] Planning/Counseling Complete  ?Palliative Care Screening Not Applicable  ?Medication Review Press photographer) Complete  ? ? ?

## 2022-03-10 NOTE — Discharge Summary (Addendum)
Physician Discharge Summary  ?George Chavez KTG:[id number] DOB: 09/15/1951 DOA: 03/07/2022 ? ?PCP: [facility] ? ?Admit date: 03/07/2022 ?Discharge date: 03/10/2022 ? ?Time spent: 35 minutes ? ?Recommendations for Outpatient Follow-up:  ?Follow-up with primary care physician in 1 to 2 weeks ? ? ?Discharge Diagnoses:  ?Principal Problem: ?  Cellulitis of right upper extremity ?Active Problems: ?  Protein-calorie malnutrition, severe ?  Severe sepsis ([place]) ?  DM2 (diabetes mellitus, type 2) ([place]) ?  Hypocalcemia ? ? ?Discharge Condition: Stable ? ? ?Filed Weights  ? 03/08/22 1716  ?Weight: 48.3 kg  ? ? ?History of present illness:  ?George Chavez is a 71 y.o. male with medical history significant for prostate cancer with metastasis to the bone, type 2 diabetes mellitus managed via lifestyle modifications, protein calorie malnutrition, who is admitted to [facility] on 03/07/2022 with severe sepsis due to right upper extremity cellulitis after presenting from home to [facility] ED complaining of  right upper extremity erythema.  ?  ?The following history is provided by the patient as well as my discussions with the EDP, and via chart review. ?  ?The patient reports 2 days of new onset right upper extremity erythema associated with swelling, tenderness, increased warmth to the touch, but denies any associated drainage, including no purulent drainage.  Also denies any associated crepitus, numbness, or paresthesias.  Denies erythema in any other location.  Reports that right upper extremity erythema is around the cubital fossa, with distal extension of erythema/swelling terminating proximal to the right wrist.  Lower extremity erythema, edema, or acute calf tenderness.  Denies any proximal extension of the right cubital fossa erythema/swelling, also denies any neck swelling. denies any associated subjective fever, chills, rigors, or generalized myalgias.  No recent chest pain, shortness of breath,  palpitations, diaphoresis, nausea, vomiting, dizziness, presyncope, syncope.  No cough, new onset abdominal pain, or diarrhea.  He also denies any recent dysuria, gross hematuria, or change in urinary urgency/frequency.  No headache or neck stiffness. ?  ?The patient was recently hospitalized at [facility] from 02/27/2022 to 03/04/2022 for constipation associated with opioid use in the setting of his history of prostate cancer with metastasis to the bone.  During this recent prior hospitalization, he reportedly had a peripheral IV in the right cubital fossa.  Otherwise, he denies any recent trauma to the right upper extremity.  additionally, he reportedly underwent chemotherapy during this most recent hospitalization, with most recent prior will cell count noted to be 1.5 on 03/04/2022.  No known history of any prior DVT/PE. ?  ?  ?ED Course:  ?Vital signs in the ED were notable for the following: Afebrile; heart rate 73-92; initial blood pressure 93/62, with subsequent increase to 120/75 following interval IV fluid bolus, as further detailed below; respiratory rate 16, oxygen saturation 98 to 100% on room air. ?  ?Labs were notable for the following: CMP notable for the following: Bicarbonate 22, creatinine 0.70 compared to most recent prior value 0.58 on 03/04/2022, glucose 72, calcium adjusted for mild hypoalbuminemia 8.6, albumin 2.9, alkaline phosphatase 197 otherwise liver enzymes within normal limits.  CBC notable for will blood cell count 44,000 with 84% neutrophils, hemoglobin 12, platelet count 444.  Initial lactate 2.4, with repeat trending down to 2.3.  ESR/CRP ordered in the ED, with results currently pending.  Blood cultures x2 collected prior to initiation of IV antibiotics.  ?  ?Imaging and additional notable ED work-up: Plain films of the right elbow showed no  evidence of acute fracture or dislocation, no evidence of significant arthritic changes, no evidence of right elbow joint effusion, nor any  evidence of subcutaneous gas. ?  ?While in the ED, the following were administered: Morphine 4 mg IV x1, cefepime, IV vancomycin, lactated Ringer's x1.5 L bolus followed by continuous LR at 100 cc/h. ?  ?Subsequently, the patient was admitted for further evaluation management of severe sepsis due to right upper extremity cellulitis.  ?  ? ?Hospital Course:  ?Patient was admitted and started on vancomycin and cefepime for the cellulitis.  Subsequent ultrasound of the upper extremity indeed showed a DVT for which he was started on Xarelto.  Over the course of the initial 3 days of hospitalization his erythema and swelling markedly improved over 50% better.  He is instructed to elevate arm at home.  He can use warm compresses to the area.  There is no induration or fluctuance or abscess formation.  He was started on Xarelto pack instructed to take 50 mg twice a day for 21 days then switch to 20 mg a day for at least 3 months.  I am also sending him home on a course of Levaquin for the next 7 days.  Patient instructed if any worsening symptoms to seek medical attention.  He is instructed that this should get a little bit better every single day and will likely completely resolve over the next 5 to 7 days.  He is to follow-up with his primary care physician in approximately 1 week and to his oncologist as routinely previously scheduled. ? ?Discharge Exam: ?Vitals:  ? 03/09/22 2102 03/10/22 0504  ?BP: 113/68 114/67  ?Pulse: 71 70  ?Resp:  20  ?Temp: 98.6 ?F (37 ?C) 98.8 ?F (37.1 ?C)  ?SpO2: 99% 100%  ? ? ?General: Alert and orient x4 no apparent distress  ?cardiovascular: Regular rate and rhythm without murmurs rubs or gallops ?Respiratory: Clear to auscultation bilaterally no wheezes rhonchi or rales ? ?Discharge Instructions ? ? ? ?Allergies as of 03/10/2022   ?No Known Allergies ?  ? ?  ?Medication List  ?  ? ?TAKE these medications   ? ?acetaminophen 500 MG tablet ?Commonly known as: TYLENOL ?Take 500 mg by mouth every  6 (six) hours as needed for mild pain. ?What changed: Another medication with the same name was added. Make sure you understand how and when to take each. ?  ?acetaminophen 325 MG tablet ?Commonly known as: TYLENOL ?Take 2 tablets (650 mg total) by mouth every 6 (six) hours as needed for mild pain (or Fever >/= 101). ?What changed: You were already taking a medication with the same name, and this prescription was added. Make sure you understand how and when to take each. ?  ?bisacodyl 10 MG suppository ?Commonly known as: DULCOLAX ?Place 1 suppository (10 mg total) rectally daily as needed for up to 12 days for moderate constipation. ?  ?feeding supplement Liqd ?Take 237 mLs by mouth 3 (three) times daily between meals. ?  ?gabapentin 100 MG capsule ?Commonly known as: NEURONTIN ?Take 200 mg by mouth 2 (two) times daily. ?  ?levofloxacin 500 MG tablet ?Commonly known as: Levaquin ?Take 1 tablet (500 mg total) by mouth daily for 7 days. ?  ?Multivitamin Adult Chew ?Chew 1 tablet by mouth daily. ?  ?naloxegol oxalate 12.5 MG Tabs tablet ?Commonly known as: MOVANTIK ?Take 1 tablet (12.5 mg total) by mouth daily. ?  ?oxyCODONE 5 MG immediate release tablet ?Commonly known as: Oxy IR/ROXICODONE ?Take  5 mg by mouth in the morning and at bedtime. ?  ?phosphorus 155-852-130 MG tablet ?Commonly known as: K PHOS NEUTRAL ?Take 1 tablet (250 mg total) by mouth daily. ?  ?polyethylene glycol 17 g packet ?Commonly known as: MIRALAX / GLYCOLAX ?Take 17 g by mouth daily as needed. ?  ?Rivaroxaban 15 MG Tabs tablet ?Commonly known as: XARELTO ?Take 1 tablet (15 mg total) by mouth 2 (two) times daily with a meal for 21 days. ?  ?rivaroxaban 20 MG Tabs tablet ?Commonly known as: XARELTO ?Take 1 tablet (20 mg total) by mouth daily with supper. Start taking after you do 21 days of 15 mg twice a day ?Start taking on: March 30, 2022 ?  ?senna 8.6 MG Tabs tablet ?Commonly known as: SENOKOT ?Take 1 tablet (8.6 mg total) by mouth 2 (two)  times daily. ?  ? ?  ? ?No Known Allergies ? ? ? ?The results of significant diagnostics from this hospitalization (including imaging, microbiology, ancillary and laboratory) are listed below for reference.   ? ?Signifi

## 2022-03-10 NOTE — Evaluation (Signed)
Occupational Therapy Evaluation ?Patient Details ?Name: George Chavez ?MRN: [id number] ?DOB: 11-26-1951 ?Today's Date: 03/10/2022 ? ? ?History of Present Illness Pt is a 71 y.o. M who presents 03/07/2022 with severe sepsis due to right upper extremity cellulitis and DVT. 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  ? ?Clinical Impression ?  ?PTA, pt reports Modified Independent with ADLs and mobility using RW though endorses recent hx of falls. Pt presents now with minor deficits in strength, endurance, standing balance and use of dominant R UE (pain, edema). Pt overall Min A to stand but able to mobilize in room using RW at min guard though fatigues quickly. Pt requires Setup for UB ADLs and Min-Mod A for LB ADLs. Educated re: AROM of R UE (provided squeeze ball as well), elevation of R UE to decrease edema, energy conservation, gradual endurance progression and compensatory strategies for LB ADLs. Pt endorses some anxiety over discharging home d/t decreased DC support today and feeling weaker than yesterday; requested to remain admitted an additional night (MD aware).   ?   ? ?Recommendations for follow up therapy are one component of a multi-disciplinary discharge planning process, led by the attending physician.  Recommendations may be updated based on patient status, additional functional criteria and insurance authorization.  ? ?Follow Up Recommendations ? Home health OT  ?  ?Assistance Recommended at Discharge Intermittent Supervision/Assistance  ?Patient can return home with the following A little help with bathing/dressing/bathroom;Assistance with cooking/housework;Assist for transportation;Help with stairs or ramp for entrance ? ?  ?Functional Status Assessment ? Patient has had a recent decline in their functional status and demonstrates the ability to make significant improvements in function in a reasonable and predictable amount of time.  ?Equipment  Recommendations ? None recommended by OT  ?  ?Recommendations for Other Services   ? ? ?  ?Precautions / Restrictions Precautions ?Precautions: Fall ?Restrictions ?Weight Bearing Restrictions: No  ? ?  ? ?Mobility Bed Mobility ?Overal bed mobility: Needs Assistance ?Bed Mobility: Supine to Sit, Sit to Supine ?  ?  ?Supine to sit: Min assist ?Sit to supine: Min assist ?  ?General bed mobility comments: handheld assist to lift trunk, MIn A for LE back into bed ?  ? ?Transfers ?Overall transfer level: Needs assistance ?Equipment used: Rolling walker (2 wheels) ?Transfers: Sit to/from Stand ?Sit to Stand: Min assist ?  ?  ?  ?  ?  ?General transfer comment: MinA to rise from edge of bed. cues for hand placement d/t R dominant UE deficits ?  ? ?  ?Balance Overall balance assessment: Needs assistance ?Sitting-balance support: No upper extremity supported ?Sitting balance-Leahy Scale: Good ?  ?  ?Standing balance support: Bilateral upper extremity supported, Reliant on assistive device for balance ?Standing balance-Leahy Scale: Poor ?  ?  ?  ?  ?  ?  ?  ?  ?  ?  ?  ?  ?   ? ?ADL either performed or assessed with clinical judgement  ? ?ADL Overall ADL's : Needs assistance/impaired ?Eating/Feeding: Set up;Sitting ?  ?Grooming: Min guard;Standing ?  ?Upper Body Bathing: Set up;Sitting ?  ?Lower Body Bathing: Sit to/from stand;Minimal assistance ?  ?Upper Body Dressing : Set up;Sitting ?  ?Lower Body Dressing: Moderate assistance;Sit to/from stand ?  ?Toilet Transfer: Minimal assistance;Ambulation;Rolling walker (2 wheels) ?  ?Toileting- Clothing Manipulation and Hygiene: Minimal assistance;Sit to/from stand ?  ?  ?  ?Functional mobility during ADLs: Rolling walker (2 wheels);Min guard ?  General ADL Comments: Pt reports feeling weaker than yesterday, reports unable to stand though able to demo mobility to sink and around room using RW. Collab regarding energy conservation (handout provided) for ADLs  ? ? ? ?Vision Baseline  Vision/History: 1 Wears glasses ?Ability to See in Adequate Light: 1 Impaired ?Patient Visual Report: No change from baseline ?Vision Assessment?: No apparent visual deficits  ?   ?Perception   ?  ?Praxis   ?  ? ?Pertinent Vitals/Pain Pain Assessment ?Pain Assessment: Faces ?Faces Pain Scale: Hurts little more ?Pain Location: RUE ?Pain Descriptors / Indicators: Discomfort, Tightness, Guarding ?Pain Intervention(s): Monitored during session, Limited activity within patient's tolerance  ? ? ? ?Hand Dominance Right ?  ?Extremity/Trunk Assessment Upper Extremity Assessment ?Upper Extremity Assessment: RUE deficits/detail ?RUE Deficits / Details: ROM impaired d/t pain and edema; light grasp (provided squeeze ball for pt to use); wrist ROM WFL; elbow flexion lacking apprx 25* due to edema/pain from DVT. Encouraged elevation, gentle ROM and squeeze ball use ?RUE Coordination: decreased fine motor;decreased gross motor ?  ?Lower Extremity Assessment ?Lower Extremity Assessment: Defer to PT evaluation ?  ?Cervical / Trunk Assessment ?Cervical / Trunk Assessment: Kyphotic ?  ?Communication Communication ?Communication: No difficulties ?  ?Cognition Arousal/Alertness: Awake/alert ?Behavior During Therapy: [facility] for tasks assessed/performed ?Overall Cognitive Status: No family/caregiver present to determine baseline cognitive functioning ?  ?  ?  ?  ?  ?  ?  ?  ?  ?  ?  ?  ?  ?  ?  ?  ?General Comments: minor contradictory information given to OT in comparison to PT yesterday. Cues for problem solving, slower processing and questionable anxiety (reported concerns in going home today due to unable to "do anything") ?  ?  ?General Comments  Reports concern over DC home today as "no one" will be there to help him. Asked if he felt he needed rehab though pt reports he may only need one more night at hospital to decrease UE swelling and regain strength - RN aware and MD messaged ? ?  ?Exercises Exercises: General Upper  Extremity ?General Exercises - Upper Extremity ?Shoulder Flexion: AROM, Right, 5 reps ?Elbow Flexion: AROM, Right, 5 reps ?Elbow Extension: AROM, Right, 5 reps ?Wrist Flexion: AROM, Right, 5 reps ?Wrist Extension: AROM, Right, 5 reps ?Digit Composite Flexion: AROM, Right, 5 reps ?Composite Extension: AROM, Right, 5 reps ?  ?Shoulder Instructions    ? ? ?Home Living Family/patient expects to be discharged to:: Private residence ?Living Arrangements: Other (Comment) (pt reports to OT that he lives alone; reported to PT that he lives with family) ?Available Help at Discharge: Family;Available PRN/intermittently ?Type of Home: House ?Home Access: Stairs to enter ?Entrance Stairs-Number of Steps: 4 ?Entrance Stairs-Rails: Right;Left ?Home Layout: One level ?  ?  ?Bathroom Shower/Tub: Tub/shower unit ?  ?Bathroom Toilet: Standard ?  ?  ?Home Equipment: Rolling Walker (2 wheels);BSC/3in1;Shower seat ?  ?Additional Comments: Per pt, he lives alone with family checking in intermittently. Pt also reports that daughter in law is in [place] until Sunday and he would have limited assist. per chart, pt previously reports near 24/7 assist from family and lives with family ?  ? ?  ?Prior Functioning/Environment Prior Level of Function : Needs assist ?  ?  ?  ?  ?  ?  ?Mobility Comments: Reports easily fatigued and only walks short distances in house; is able to get out to car with assist from family and neighbors; uses RW; 2 falls  in last 6 months ?ADLs Comments: Reports independent but modified and fatigues easily; only does sponge baths; uses BSC if family not there ?  ? ?  ?  ?OT Problem List: Decreased strength;Decreased activity tolerance;Impaired balance (sitting and/or standing);Decreased cognition;Decreased coordination;Impaired UE functional use ?  ?   ?OT Treatment/Interventions: Self-care/ADL training;Therapeutic exercise;DME and/or AE instruction;Energy conservation;Therapeutic activities;Patient/family education;Balance  training  ?  ?OT Goals(Current goals can be found in the care plan section) Acute Rehab OT Goals ?Patient Stated Goal: go home tomorrow ?OT Goal Formulation: With patient ?Time For Goal Achievement: 03/24/22 ?Potential to Achieve Go

## 2022-03-10 NOTE — Progress Notes (Signed)
OT Cancellation Note ? ?Patient Details ?Name: George Chavez ?MRN: [id number] ?DOB: 1951-06-28 ? ? ?Cancelled Treatment:    Reason Eval/Treat Not Completed: Other (comment) Currently with breakfast tray. Will follow-up this for OT eval as schedule permits. ? ?Layla Maw ?03/10/2022, 7:17 AM ?

## 2022-03-11 ENCOUNTER — Other Ambulatory Visit (HOSPITAL_COMMUNITY): Payer: Self-pay

## 2022-03-11 LAB — GLUCOSE, CAPILLARY: Glucose-Capillary: 92 mg/dL (ref 70–99)

## 2022-03-11 MED ORDER — RIVAROXABAN (XARELTO) VTE STARTER PACK (15 & 20 MG)
15.0000 mg | ORAL_TABLET | Freq: Two times a day (BID) | ORAL | 0 refills | Status: AC
  Filled 2022-03-11: qty 51, 30d supply, fill #0

## 2022-03-11 MED ORDER — RIVAROXABAN 20 MG PO TABS
20.0000 mg | ORAL_TABLET | Freq: Every day | ORAL | 0 refills | Status: AC
  Filled 2022-03-11: qty 30, 30d supply, fill #0

## 2022-03-11 MED ORDER — LEVOFLOXACIN 500 MG PO TABS
500.0000 mg | ORAL_TABLET | Freq: Every day | ORAL | 0 refills | Status: AC
  Filled 2022-03-11: qty 7, 7d supply, fill #0

## 2022-03-11 NOTE — Care Management Important Message (Signed)
Important Message ? ?Patient Details  ?Name: George Chavez ?MRN: [id number] ?Date of Birth: [birth year] ? ? ?Medicare Important Message Given:  Yes ? ?Patient left prior to IM delivery will mail to the patient home address.  ? ? ?[name] ?03/11/2022, 12:14 PM ?

## 2022-03-11 NOTE — Progress Notes (Signed)
Occupational Therapy Treatment ?Patient Details ?Name: George Chavez ?MRN: [id number] ?DOB: 1951-07-06 ?Today's Date: 03/11/2022 ? ? ?History of present illness Pt is a 71 y.o. M who presents 03/07/2022 with severe sepsis due to right upper extremity cellulitis and DVT. 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 ?  ?OT comments ? Patient making good progress with min guard to get to EOB and transfers with min assist to power up. Patient performed self care tasks with mod assist for LB dressing due to difficulty threading legs into clothing but able to pull up.  Patient is expected to discharge home today with [facility].  ? ?Recommendations for follow up therapy are one component of a multi-disciplinary discharge planning process, led by the attending physician.  Recommendations may be updated based on patient status, additional functional criteria and insurance authorization. ?   ?Follow Up Recommendations ? Home health OT  ?  ?Assistance Recommended at Discharge Intermittent Supervision/Assistance  ?Patient can return home with the following ? A little help with bathing/dressing/bathroom;Assistance with cooking/housework;Assist for transportation;Help with stairs or ramp for entrance ?  ?Equipment Recommendations ? None recommended by OT  ?  ?Recommendations for Other Services   ? ?  ?Precautions / Restrictions Precautions ?Precautions: Fall ?Restrictions ?Weight Bearing Restrictions: No  ? ? ?  ? ?Mobility Bed Mobility ?Overal bed mobility: Needs Assistance ?Bed Mobility: Supine to Sit ?  ?  ?Supine to sit: Min guard ?  ?  ?General bed mobility comments: cues for rail use and scooting to EOB ?  ? ?Transfers ?Overall transfer level: Needs assistance ?Equipment used: Rolling walker (2 wheels) ?Transfers: Sit to/from Stand ?Sit to Stand: Min assist ?  ?  ?  ?  ?  ?General transfer comment: min assist topower up and min guard for transfers ?  ?  ?Balance Overall balance  assessment: Needs assistance ?Sitting-balance support: No upper extremity supported ?Sitting balance-Leahy Scale: Good ?  ?  ?Standing balance support: Bilateral upper extremity supported, Single extremity supported, During functional activity ?Standing balance-Leahy Scale: Poor ?Standing balance comment: able to use one extremity for toilet hygiene and pulling up clothing while standing ?  ?  ?  ?  ?  ?  ?  ?  ?  ?  ?  ?   ? ?ADL either performed or assessed with clinical judgement  ? ?ADL Overall ADL's : Needs assistance/impaired ?  ?  ?Grooming: Wash/dry hands;Wash/dry face;Min guard;Standing ?Grooming Details (indicate cue type and reason): at sink ?Upper Body Bathing: Set up;Sitting ?  ?Lower Body Bathing: Sit to/from stand;Minimal assistance ?  ?Upper Body Dressing : Set up;Sitting ?Upper Body Dressing Details (indicate cue type and reason): donned T-shirt ?Lower Body Dressing: Moderate assistance;Sit to/from stand ?Lower Body Dressing Details (indicate cue type and reason): difficulty threading clothing over feet, able to pull up ?Toilet Transfer: Min guard;Ambulation;BSC/3in1;Rolling walker (2 wheels) ?Toilet Transfer Details (indicate cue type and reason): verbal cues for safety ?Toileting- Clothing Manipulation and Hygiene: Minimal assistance;Sit to/from stand ?Toileting - Clothing Manipulation Details (indicate cue type and reason): to complete ?  ?  ?  ?General ADL Comments: began self care standing at sink, completed sitting on 3n1 ?  ? ?Extremity/Trunk Assessment Upper Extremity Assessment ?RUE Deficits / Details: ROM impaired d/t pain and edema; light grasp (provided squeeze ball for pt to use); wrist ROM WFL; elbow flexion lacking apprx 25* due to edema/pain from DVT. Encouraged elevation, gentle ROM and squeeze ball use ?RUE Coordination: decreased fine  motor;decreased gross motor ?LUE Deficits / Details: ROM WFL - some limitations at shoulders but functional; MMT 4-/5 ?  ?  ?  ?  ?  ? ?Vision   ?   ?  ?Perception   ?  ?Praxis   ?  ? ?Cognition Arousal/Alertness: Awake/alert ?Behavior During Therapy: [facility] for tasks assessed/performed ?Overall Cognitive Status: No family/caregiver present to determine baseline cognitive functioning ?  ?  ?  ?  ?  ?  ?  ?  ?  ?  ?  ?  ?  ?  ?  ?  ?General Comments: cues for safety with walker use ?  ?  ?   ?Exercises   ? ?  ?Shoulder Instructions   ? ? ?  ?General Comments    ? ? ?Pertinent Vitals/ Pain       Pain Assessment ?Pain Assessment: Faces ?Faces Pain Scale: Hurts little more ?Pain Location: RUE ?Pain Descriptors / Indicators: Discomfort, Tightness, Guarding ? ?Home Living   ?  ?  ?  ?  ?  ?  ?  ?  ?  ?  ?  ?  ?  ?  ?  ?  ?  ?  ? ?  ?Prior Functioning/Environment    ?  ?  ?  ?   ? ?Frequency ? Min 2X/week  ? ? ? ? ?  ?Progress Toward Goals ? ?OT Goals(current goals can now be found in the care plan section) ? Progress towards OT goals: Progressing toward goals ? ?Acute Rehab OT Goals ?Patient Stated Goal: go home ?OT Goal Formulation: With patient ?Time For Goal Achievement: 03/24/22 ?Potential to Achieve Goals: Good ?ADL Goals ?Pt Will Perform Grooming: with modified independence;standing ?Pt Will Perform Lower Body Bathing: with modified independence;sit to/from stand ?Pt Will Transfer to Toilet: with modified independence;ambulating ?Pt/caregiver will Perform Home Exercise Program: Increased ROM;Increased strength;Right Upper extremity;Independently ?Additional ADL Goal #1: Pt to verbalize at least 3 fall prevention strategies to implement in home environment  ?Plan Discharge plan remains appropriate   ? ?Co-evaluation ? ? ?   ?  ?  ?  ?  ? ?  ?AM-PAC OT "6 Clicks" Daily Activity     ?Outcome Measure ? ? Help from another person eating meals?: A Little ?Help from another person taking care of personal grooming?: A Little ?Help from another person toileting, which includes using toliet, bedpan, or urinal?: A Little ?Help from another person bathing (including washing,  rinsing, drying)?: A Little ?Help from another person to put on and taking off regular upper body clothing?: A Little ?Help from another person to put on and taking off regular lower body clothing?: A Lot ?6 Click Score: 17 ? ?  ?End of Session Equipment Utilized During Treatment: Rolling walker (2 wheels) ? ?OT Visit Diagnosis: Other abnormalities of gait and mobility (R26.89);Muscle weakness (generalized) (M62.81);Unsteadiness on feet (R26.81);History of falling (Z91.81) ?  ?Activity Tolerance Patient tolerated treatment well ?  ?Patient Left in chair;with call bell/phone within reach ?  ?Nurse Communication Mobility status ?  ? ?   ? ?Time: [phone] ?OT Time Calculation (min): 25 min ? ?Charges: OT General Charges ?$OT Visit: 1 Visit ?OT Treatments ?$Self Care/Home Management : 23-37 mins ? ?Lodema Hong, OTA ?Acute Rehabilitation Services  ?Pager [phone] ?Office [phone] ? ? ?[place] ?03/11/2022, 8:25 AM ?

## 2022-03-11 NOTE — Care Management Important Message (Signed)
Important Message ? ?Patient Details  ?Name: George Chavez ?MRN: [id number] ?Date of Birth: 1950-12-21 ? ? ?Medicare Important Message Given:  Yes ? ? ? ? ?[name] ?03/11/2022, 12:13 PM ?

## 2022-03-11 NOTE — TOC Transition Note (Signed)
Transition of Care (TOC) - CM/SW Discharge Note ? ? ?Patient Details  ?Name: George Chavez ?MRN: [id number] ?Date of Birth: 1951-09-29 ? ?Transition of Care (TOC) CM/SW Contact:  ?Tom-Johnson, Renea Ee, RN ?Phone Number: ?03/11/2022, 9:57 AM ? ? ?Clinical Narrative:    ? ?Patient will be discharging home today. Family to transport. No further TOC needs noted. ? ?Final next level of care: [place] ?Barriers to Discharge: Barriers Resolved ? ? ?Patient Goals and CMS Choice ?Patient states their goals for this hospitalization and ongoing recovery are:: To return home ?CMS Medicare.gov Compare Post Acute Care list provided to:: Patient ?Choice offered to / list presented to : Patient ? ?Discharge Placement ?  ?           ?  ?Patient to be transferred to facility by: Family ?  ?  ? ?Discharge Plan and Services ?  ?Discharge Planning Services: CM Consult ?Post Acute Care Choice: Home Health          ?  ?  ?  ?  ?  ?HH Arranged: Therapist, sports, Nurse's Aide ?[place] Agency: [place] ?Date HH Agency Contacted: 03/09/22 ?Time [place]: 1520 ?Representative spoke with at [place]: Marjory Lies ? ?Social Determinants of Health (SDOH) Interventions ?  ? ? ?Readmission Risk Interventions ? ?  03/10/2022  ? 12:23 PM  ?Readmission Risk Prevention Plan  ?Transportation Screening Complete  ?PCP or Specialist Appt within 3-5 Days Complete  ?[place] or Home Care Consult Complete  ?Social Work Consult for [place] Planning/Counseling Complete  ?Palliative Care Screening Not Applicable  ?Medication Review Press photographer) Complete  ? ? ? ? ? ?

## 2022-03-12 LAB — CULTURE, BLOOD (ROUTINE X 2)
Culture: NO GROWTH
Culture: NO GROWTH

## 2022-03-13 NOTE — Anesthesia Postprocedure Evaluation (Signed)
Anesthesia Post Note ? ?Patient: ELDER DAVIDIAN ? ?Procedure(s) Performed: FLEXIBLE SIGMOIDOSCOPY ? ?  ? ?Patient location during evaluation: PACU ?Anesthesia Type: MAC ?Level of consciousness: awake and alert ?Pain management: pain level controlled ?Vital Signs Assessment: post-procedure vital signs reviewed and stable ?Respiratory status: spontaneous breathing, nonlabored ventilation, respiratory function stable and patient connected to nasal cannula oxygen ?Cardiovascular status: stable and blood pressure returned to baseline ?Postop Assessment: no apparent nausea or vomiting ?Anesthetic complications: no ? ? ?No notable events documented. ? ?Last Vitals:  ?Vitals:  ? 03/04/22 1113 03/04/22 1550  ?BP: 127/72 109/66  ?Pulse: 84 94  ?Resp: 18 17  ?Temp: 36.6 ?C 37 ?C  ?SpO2: 99% 100%  ?  ?Last Pain:  ?Vitals:  ? 03/04/22 1028  ?TempSrc:   ?PainSc: 0-No pain  ? ? ?  ?  ?  ?  ?  ?  ? ?March Rummage Lisbeth Puller ? ? ? ? ?

## 2022-03-18 ENCOUNTER — Other Ambulatory Visit (HOSPITAL_COMMUNITY): Payer: Self-pay

## 2022-03-18 ENCOUNTER — Telehealth (HOSPITAL_COMMUNITY): Payer: Self-pay

## 2022-03-18 NOTE — Telephone Encounter (Signed)
Pharmacy Transitions of Care Follow-up Telephone Call ? ?Date of discharge: 03/11/22  ?Discharge Diagnosis: DVT ? ?How have you been since you were released from the hospital? Spoke to patient and caretaker over the phone. No questions about meds at this time.  ? ?Medication changes made at discharge: ?START taking: ?levofloxacin (Levaquin)  ?Xarelto Starter Pack (Rivaroxaban Stater Pack (15 mg and 20 mg))  ?rivaroxaban (XARELTO)  ?Start taking on: March 30, 2022 ?Icon medications to change how you take   CHANGE how you take: ?acetaminophen (TYLENOL)  ? ?Medication changes verified by the patient? Yes ?  ? ?Medication Accessibility: ? ?Home Pharmacy: [address]  ? ?Was the patient provided with refills on discharged medications? Yes  ? ?Have all prescriptions been transferred from [facility] to home pharmacy? Yes  ? ?Is the patient able to afford medications? Has insurance ?  ? ?Medication Review: ? ?RIVAROXABAN (XARELTO)  ?Rivaroxaban 15 mg BID initiated on 03/09/22. Will switch to 20 mg daily after 21 days (DATE 03/30/22).   ?- Discussed importance of taking medication with food and around the same time everyday  ?- Advised patient of medications to avoid (NSAIDs, ASA)  ?- Educated that Tylenol (acetaminophen) will be the preferred analgesic to prevent risk of bleeding  ?- Emphasized importance of monitoring for signs and symptoms of bleeding (abnormal bruising, prolonged bleeding, nose bleeds, bleeding from gums, discolored urine, black tarry stools)  ?- Advised patient to alert all providers of anticoagulation therapy prior to starting a new medication or having a procedure  ? ?Follow-up Appointments: ? ?PCP Hospital f/u appt confirmed? Scheduled to see Dr. Kym Groom on 03/23/22 @ 2:40PM.  ? ?[facility] f/u appt confirmed? Scheduled to see Dr. Lamonte Sakai on 04/08/22 @ 1:30PM.  ? ?If their condition worsens, is the pt aware to call PCP or go to the Emergency Dept.? Yes ? ?Final Patient  Assessment: ?Patient has f/u scheduled and refills at home pharmacy ? ? ? ?

## 2022-03-19 ENCOUNTER — Encounter (HOSPITAL_COMMUNITY): Payer: Self-pay | Admitting: Emergency Medicine

## 2022-03-19 ENCOUNTER — Emergency Department (HOSPITAL_COMMUNITY)
Admission: EM | Admit: 2022-03-19 | Discharge: 2022-03-19 | Disposition: A | Discharge status: Home / Self Care | Payer: Medicare HMO | Attending: Emergency Medicine | Admitting: Emergency Medicine

## 2022-03-19 ENCOUNTER — Other Ambulatory Visit: Payer: Self-pay

## 2022-03-19 DIAGNOSIS — M7989 Other specified soft tissue disorders: Secondary | ICD-10-CM | POA: Diagnosis not present

## 2022-03-19 DIAGNOSIS — Z7901 Long term (current) use of anticoagulants: Secondary | ICD-10-CM | POA: Diagnosis not present

## 2022-03-19 DIAGNOSIS — D72829 Elevated white blood cell count, unspecified: Secondary | ICD-10-CM | POA: Diagnosis not present

## 2022-03-19 DIAGNOSIS — R21 Rash and other nonspecific skin eruption: Secondary | ICD-10-CM | POA: Diagnosis not present

## 2022-03-19 DIAGNOSIS — Z8546 Personal history of malignant neoplasm of prostate: Secondary | ICD-10-CM | POA: Insufficient documentation

## 2022-03-19 DIAGNOSIS — E119 Type 2 diabetes mellitus without complications: Secondary | ICD-10-CM | POA: Diagnosis not present

## 2022-03-19 LAB — BASIC METABOLIC PANEL
Anion gap: 5 (ref 5–15)
BUN: 12 mg/dL (ref 8–23)
CO2: 25 mmol/L (ref 22–32)
Calcium: 7.9 mg/dL — ABNORMAL LOW (ref 8.9–10.3)
Chloride: 108 mmol/L (ref 98–111)
Creatinine, Ser: 0.67 mg/dL (ref 0.61–1.24)
GFR, Estimated: >60 mL/min (ref >60)
Glucose, Bld: 174 mg/dL — ABNORMAL HIGH (ref 70–99)
Potassium: 4.6 mmol/L (ref 3.5–5.1)
Sodium: 138 mmol/L (ref 135–145)

## 2022-03-19 LAB — CBC WITH DIFFERENTIAL/PLATELET
Abs Immature Granulocytes: 0.15 10*3/uL — ABNORMAL HIGH (ref 0.00–0.07)
Basophils Absolute: 0.1 10*3/uL (ref 0.0–0.1)
Basophils Relative: 0 %
Eosinophils Absolute: 0 10*3/uL (ref 0.0–0.5)
Eosinophils Relative: 0 %
HCT: 31.2 % — ABNORMAL LOW (ref 39.0–52.0)
Hemoglobin: 10.2 g/dL — ABNORMAL LOW (ref 13.0–17.0)
Immature Granulocytes: 1 %
Lymphocytes Relative: 6 %
Lymphs Abs: 0.7 10*3/uL (ref 0.7–4.0)
MCH: 27.5 pg (ref 26.0–34.0)
MCHC: 32.7 g/dL (ref 30.0–36.0)
MCV: 84.1 fL (ref 80.0–100.0)
Monocytes Absolute: 0.6 10*3/uL (ref 0.1–1.0)
Monocytes Relative: 6 %
Neutro Abs: 10 10*3/uL — ABNORMAL HIGH (ref 1.7–7.7)
Neutrophils Relative %: 87 %
Platelets: 434 10*3/uL — ABNORMAL HIGH (ref 150–400)
RBC: 3.71 MIL/uL — ABNORMAL LOW (ref 4.22–5.81)
RDW: 25.2 % — ABNORMAL HIGH (ref 11.5–15.5)
WBC: 11.5 10*3/uL — ABNORMAL HIGH (ref 4.0–10.5)
nRBC: 0 % (ref 0.0–0.2)

## 2022-03-19 NOTE — Discharge Instructions (Signed)
You were evaluated in the Emergency Department and after careful evaluation, we did not find any emergent condition requiring admission or further testing in the hospital.  Your exam/testing today was overall reassuring.  Please return to the Emergency Department if you experience any worsening of your condition.  Thank you for allowing us to be a part of your care.  

## 2022-03-19 NOTE — ED Triage Notes (Signed)
Pt states that he was here and admitted x 10 days ago for RUE cellulitis. Pt states that he believes that his arm is swelling again, states that it is better than it was last week.  ?

## 2022-03-19 NOTE — ED Provider Notes (Signed)
?[facility] ?Provider Note ? ? ?CSN: [id number] ?Arrival date & time: 03/19/22  1920 ? ?  ? ?History ?Chief Complaint  ?Patient presents with  ? Arm Swelling  ? ? ?George Chavez is a 71 y.o. male w/ hx of prostate cancer with mets to his bone presenting for increase in RUE swelling. Pt was admitted for suspected cellulitis in that arm after his chemo infiltrated and then was found to has a blood clot. He has been taking xarleto as prescribed. Daughter in law noticed swelling to his humerus area today and wanted to make sure everything was ok. No new pain, redness, skin changes, or tingling/ numbness. No fevers, chills, nausea, or vomiting.  ? ?HPI ? ?  ? ?Home Medications ?Prior to Admission medications   ?Medication Sig Start Date End Date Taking? Authorizing Provider  ?acetaminophen (TYLENOL) 325 MG tablet Take 2 tablets (650 mg total) by mouth every 6 (six) hours as needed for mild pain (or Fever >/= 101). 03/10/22   Phillips Grout, MD  ?acetaminophen (TYLENOL) 500 MG tablet Take 500 mg by mouth every 6 (six) hours as needed for mild pain.    [provider]  ?feeding supplement (ENSURE ENLIVE / ENSURE PLUS) LIQD Take 237 mLs by mouth 3 (three) times daily between meals. 03/04/22   Terrilee Croak, MD  ?gabapentin (NEURONTIN) 100 MG capsule Take 200 mg by mouth 2 (two) times daily. 02/05/22   [provider]  ?Multiple Vitamins-Minerals (MULTIVITAMIN ADULT) CHEW Chew 1 tablet by mouth daily.    [provider]  ?naloxegol oxalate (MOVANTIK) 12.5 MG TABS tablet Take 1 tablet (12.5 mg total) by mouth daily. 03/05/22 04/04/22  Terrilee Croak, MD  ?oxyCODONE (OXY IR/ROXICODONE) 5 MG immediate release tablet Take 5 mg by mouth in the morning and at bedtime. 11/19/21   [provider]  ?phosphorus (K PHOS NEUTRAL) [id number] MG tablet Take 1 tablet (250 mg total) by mouth daily. ?Patient not taking: Reported on 03/07/2022 03/04/22 04/03/22  Terrilee Croak,  MD  ?polyethylene glycol (MIRALAX / GLYCOLAX) 17 g packet Take 17 g by mouth daily as needed. ?Patient not taking: Reported on 03/07/2022 03/04/22 04/03/22  Terrilee Croak, MD  ?RIVAROXABAN Alveda Reasons) VTE STARTER PACK (15 & 20 MG) Take 1 tablet (15 mg total) by mouth 2 times daily with a meal for 21 days. Then take 1 tablet ('20mg'$ ) daily with supper. 03/11/22   Phillips Grout, MD  ?rivaroxaban (XARELTO) 20 MG TABS tablet Take 1 tablet (20 mg total) by mouth daily with supper. 03/30/22   Phillips Grout, MD  ?senna (SENOKOT) 8.6 MG TABS tablet Take 1 tablet (8.6 mg total) by mouth 2 (two) times daily. 03/04/22 04/03/22  Terrilee Croak, MD  ?   ? ?Allergies    ?Patient has no known allergies.   ? ?Review of Systems   ?Review of Systems  ?Musculoskeletal:  Positive for joint swelling.  ? ?Physical Exam ?Updated Vital Signs ?BP 116/85 (BP Location: Left Arm)   Pulse 100   Temp 98.5 ?F (36.9 ?C) (Oral)   Resp 16   Ht '5\' 8"'$  (1.727 m)   Wt 50.9 kg   SpO2 100%   BMI 17.06 kg/m?  ?Physical Exam ?Vitals and nursing note reviewed.  ?Constitutional:   ?   Appearance: He is not ill-appearing.  ?Eyes:  ?   General: No scleral icterus. ?   Pupils: Pupils are equal, round, and reactive to light.  ?Cardiovascular:  ?  Rate and Rhythm: Normal rate and regular rhythm.  ?Pulmonary:  ?   Effort: Pulmonary effort is normal. No respiratory distress.  ?   Breath sounds: Normal breath sounds.  ?Abdominal:  ?   General: There is no distension.  ?   Tenderness: There is no abdominal tenderness. There is no guarding or rebound.  ?Skin: ?   Findings: Rash present.  ?   Comments: Discoloration of R lower arm, swelling to R arm/ medial skin overlying humerus ?Nontender, no warmth. Intact radial pulses. Sensation intact.   ?Neurological:  ?   Mental Status: He is alert and oriented to person, place, and time.  ?Psychiatric:     ?   Mood and Affect: Mood normal.     ?   Behavior: Behavior normal.  ? ? ?ED Results / Procedures / Treatments    ?Labs ?(all labs ordered are listed, but only abnormal results are displayed) ?Labs Reviewed  ?CBC WITH DIFFERENTIAL/PLATELET - Abnormal; Notable for the following components:  ?    Result Value  ? WBC 11.5 (*)   ? RBC 3.71 (*)   ? Hemoglobin 10.2 (*)   ? HCT 31.2 (*)   ? RDW 25.2 (*)   ? Platelets 434 (*)   ? All other components within normal limits  ?BASIC METABOLIC PANEL  ? ? ?EKG ?None ? ?Radiology ?No results found. ? ?Procedures ?Procedures  ? ?Medications Ordered in ED ?Medications - No data to display ? ?ED Course/ Medical Decision Making/ A&P ? ?                        ?Medical Decision Making ?Amount and/or Complexity of Data Reviewed ?Labs: ordered. Decision-making details documented in ED Course. ? ? ?Chart review revealed details surrounding chemo infiltration leading to blood clot in his RUE for which he was d/c on 03/10/22. PMHx includes:prostate cancer with metastasis to the bone, type 2 diabetes mellitus managed via lifestyle modifications, protein calorie malnutrition. Collateral obtained from daughter in law. Shows pictures of progression.  ? ?Labs improving from admission. Leukocytosis improving. WBC 11.5, Hg 10.2, Plat 434, glucose 174. No indication for emergent imaging at this time due to taking blood thinner as prescribed. Recommending close outpatient follow-up and continue compliance with xarelto.  ? ?Pt seen in conjunction with my attending Dr. Zenia Resides.  ? ?Final Clinical Impression(s) / ED Diagnoses ?Final diagnoses:  ?Arm swelling  ? ? ?Rx / DC Orders ?ED Discharge Orders   ? ? None  ? ?  ? ? ?  ?Lupita Dawn, MD ?03/20/22 1131 ? ?  ?Lacretia Leigh, MD ?03/21/22 1401 ? ?

## 2022-03-19 NOTE — ED Provider Notes (Signed)
I saw and evaluated the patient, reviewed the resident's note and I agree with the findings and plan. ? ?Patient presents with right arm swelling.  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  Is on Xarelto.  He is neuro vas intact in his right hand.  Will be discharged ?  ?Lacretia Leigh, MD ?03/19/22 2244 ? ?

## 2022-05-04 ENCOUNTER — Other Ambulatory Visit: Payer: Self-pay

## 2022-05-04 DIAGNOSIS — Z515 Encounter for palliative care: Secondary | ICD-10-CM

## 2022-05-04 NOTE — Progress Notes (Signed)
PATIENT NAME: George Chavez DOB: 1951/09/24 MRN: [id number]  PRIMARY CARE PROVIDER: [facility]  RESPONSIBLE PARTY:  Acct ID - Guarantor Home Phone Work Phone Relationship Acct Type  [id number] RAMA, [phone]  Self P/F     [address] [place], [place] [address]     Connected with daughter George Chavez by telephone.  She advised her spouse received a call from Palliative Care services today and she needed clarification.   Patient is now being followed by [facility] and continues to have [facility] involved.   Daughter is uncertain of two different Palliative Care services are needed.  I advised we could do home visits and collaborate with [facility].  At this time, daughter feels they have sufficient support with agencies that are in place.  Advised [facility] would discharge at this time.   Should our assistance be needed in the future, George Chavez is advised to request a referral from PCP.

## 2023-04-30 IMAGING — DX DG ABDOMEN 1V
1 series · 1 of 1 positions shown · non-contrast
Comparison: None available.

CLINICAL DATA: Constipation, no bowel movement for 5 days.

EXAM:
ABDOMEN - 1 VIEW

[abdomen kub]
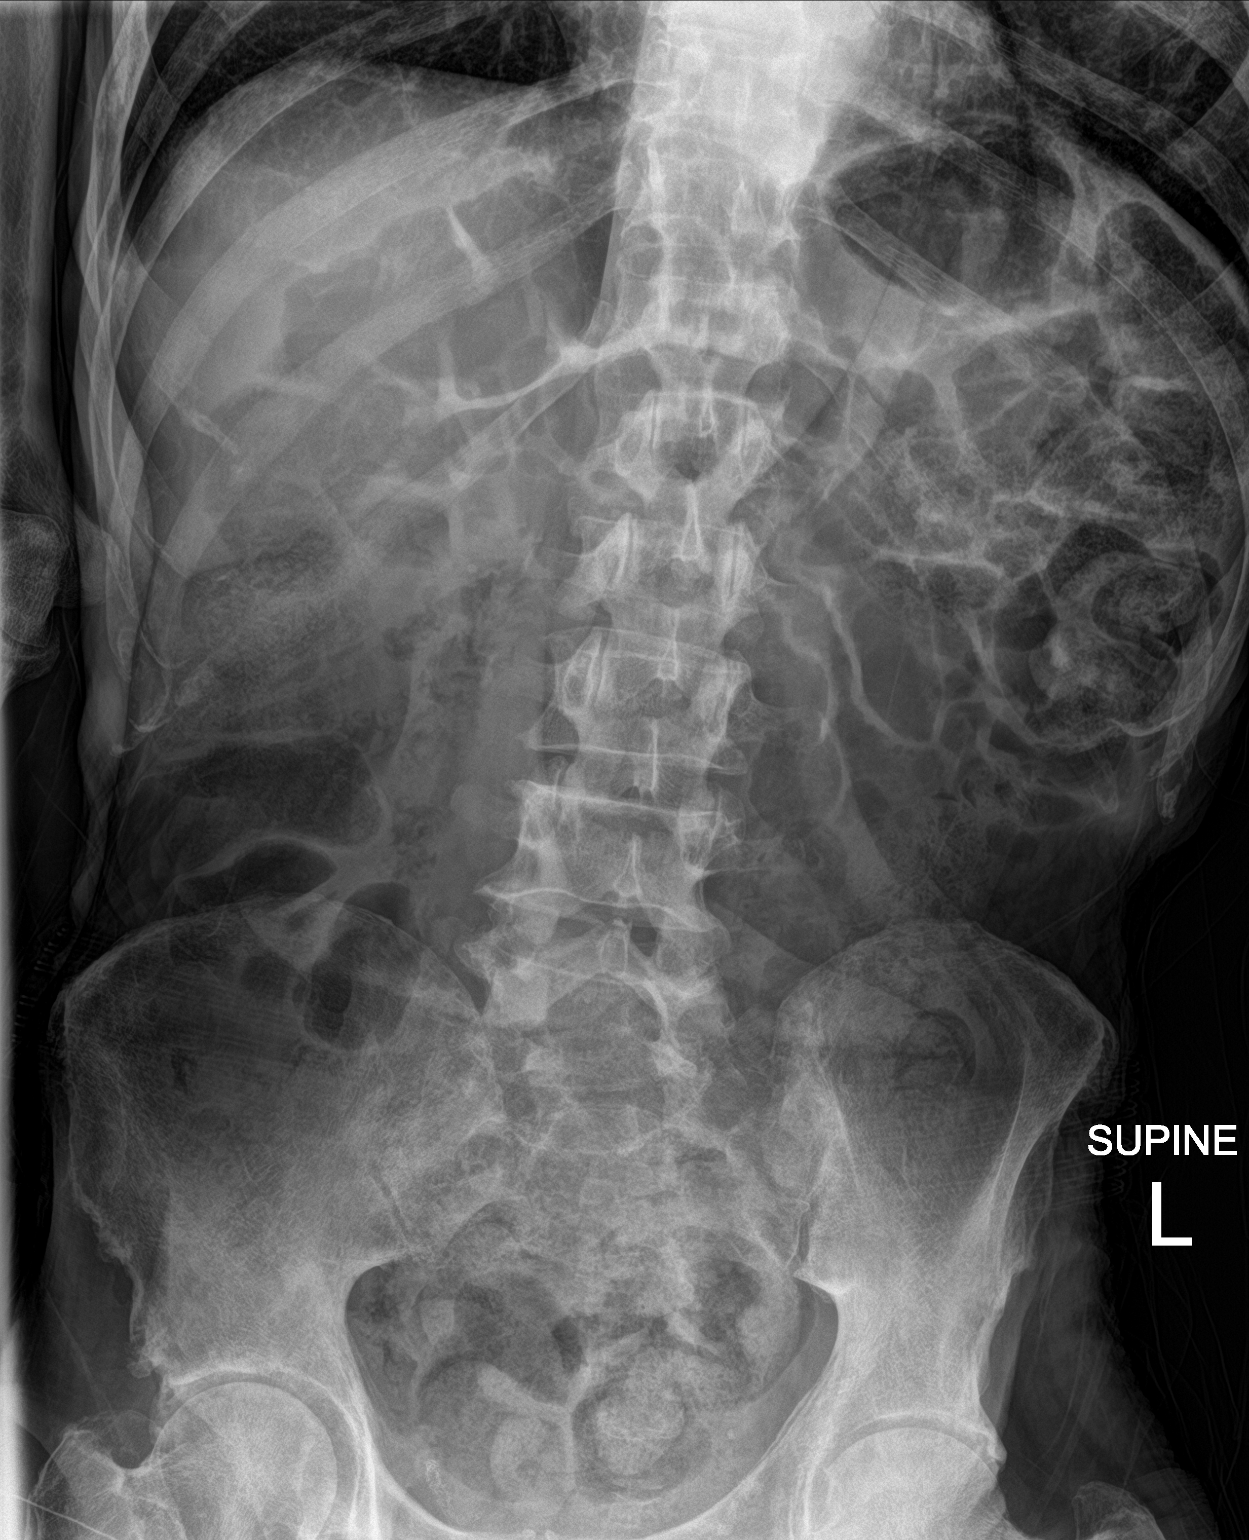

[1 of 1 positions shown; findings below may reference images not displayed]

FINDINGS: Large volume of colonic stool. Increased small bowel gas without
abnormal distension or obstruction. No obvious free air on the
supine view. No visualized radiopaque calculi. Included lung bases
are clear.
IMPRESSION: Large volume of colonic stool, suggesting constipation. No bowel
obstruction.

## 2023-04-30 IMAGING — DX DG CHEST 1V PORT
1 series · 1 of 1 positions shown · non-contrast
Comparison: February 02, 2012

CLINICAL DATA: Questionable sepsis.

EXAM:
PORTABLE CHEST 1 VIEW

[chest]
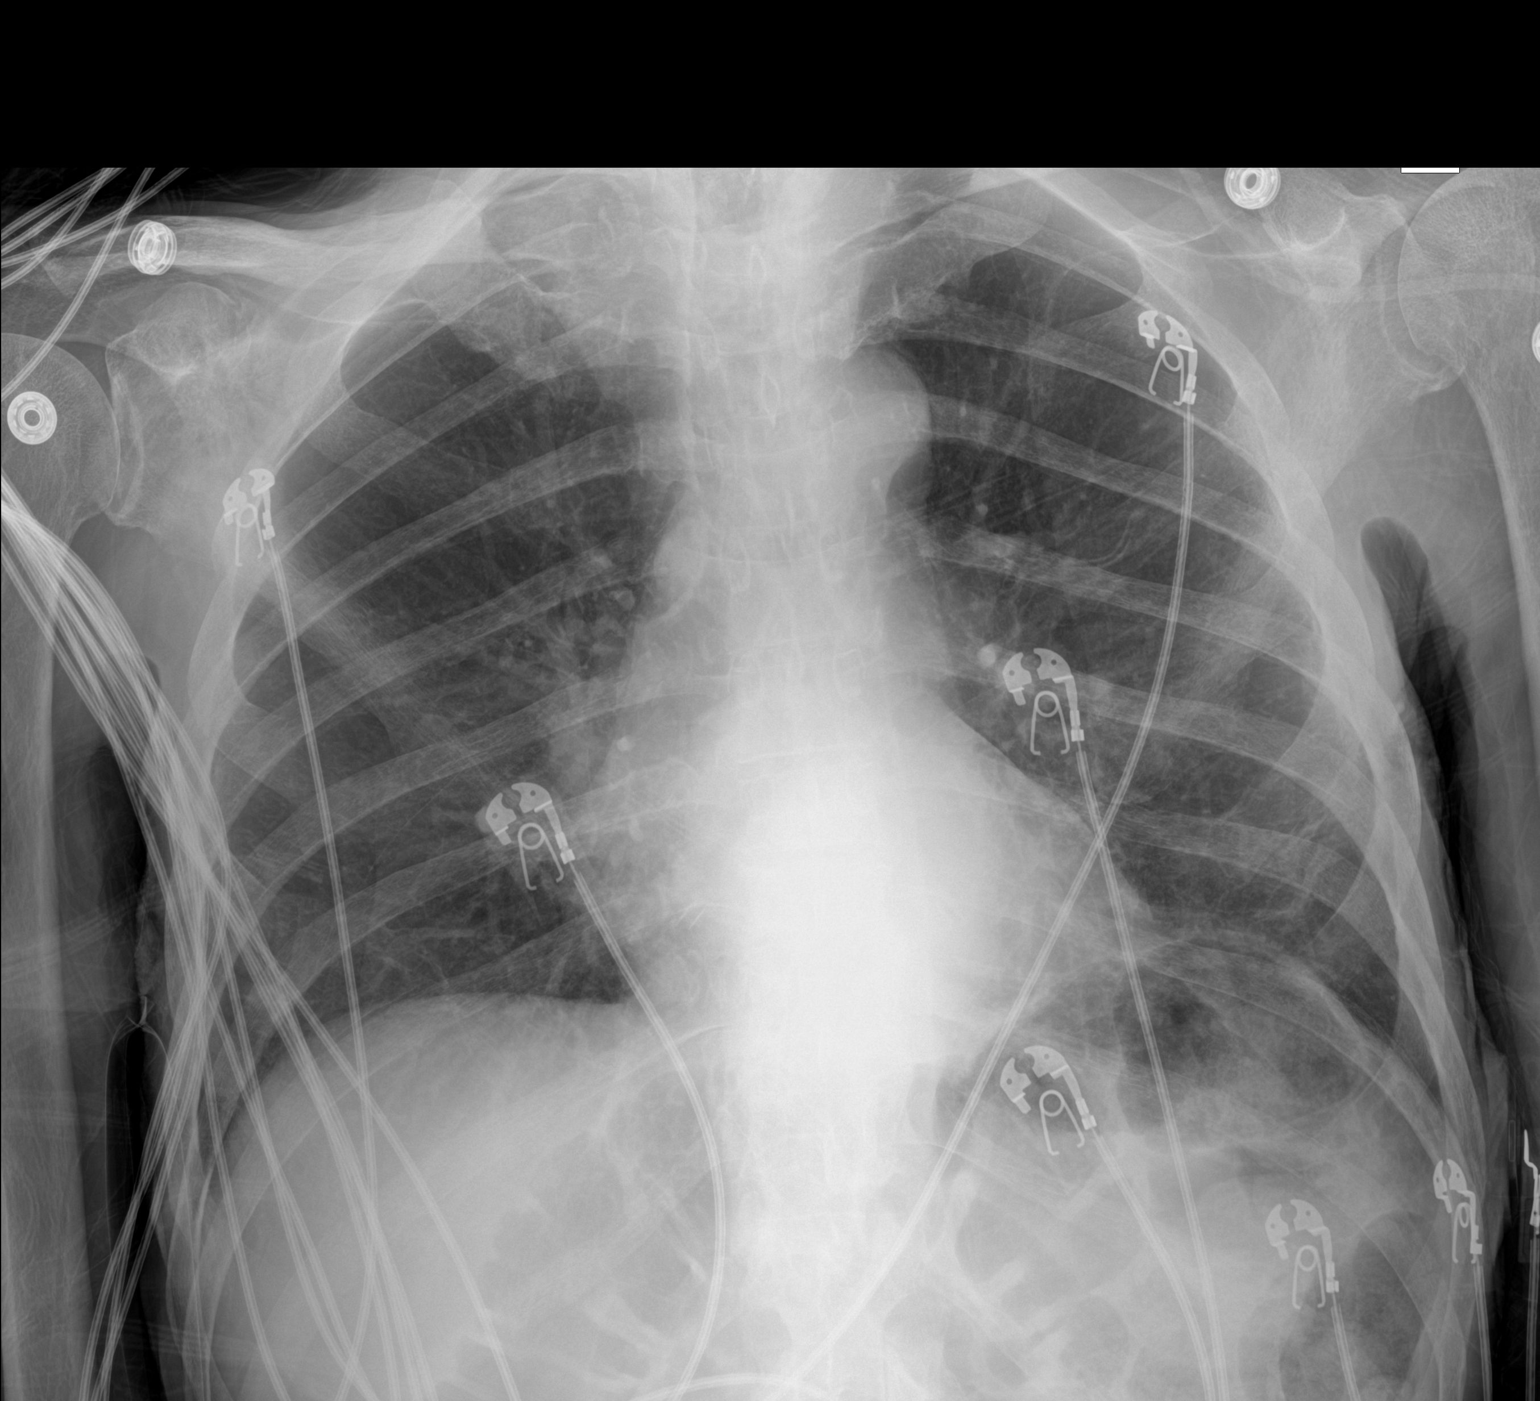

[1 of 1 positions shown; findings below may reference images not displayed]

FINDINGS: The heart, hila, and mediastinum are unchanged and unremarkable. No
pneumothorax. Mild opacity in left base is somewhat streaky in
appearance. No other acute abnormalities.
IMPRESSION: Mild opacity in left base is favored represent atelectasis. Early
developing infiltrate considered less likely. No other abnormalities
or changes.
